# Patient Record
Sex: Male | Born: 1970 | Race: White | Hispanic: No | Marital: Single | Smoking: Never smoker
Health system: Southern US, Community
[De-identification: ages and names within clinical notes are randomized; demographics above are authoritative.]

## PROBLEM LIST (undated history)

## (undated) DIAGNOSIS — M1990 Unspecified osteoarthritis, unspecified site: Secondary | ICD-10-CM

## (undated) DIAGNOSIS — F1010 Alcohol abuse, uncomplicated: Secondary | ICD-10-CM

## (undated) DIAGNOSIS — M069 Rheumatoid arthritis, unspecified: Secondary | ICD-10-CM

## (undated) DIAGNOSIS — A6002 Herpesviral infection of other male genital organs: Secondary | ICD-10-CM

## (undated) HISTORY — DX: Alcohol abuse, uncomplicated: F10.10

## (undated) HISTORY — DX: Herpesviral infection of other male genital organs: A60.02

## (undated) HISTORY — DX: Unspecified osteoarthritis, unspecified site: M19.90

## (undated) HISTORY — DX: Rheumatoid arthritis, unspecified: M06.9

---

## 2013-08-06 ENCOUNTER — Ambulatory Visit (INDEPENDENT_AMBULATORY_CARE_PROVIDER_SITE_OTHER): Payer: No Typology Code available for payment source | Admitting: Physician Assistant

## 2013-08-06 ENCOUNTER — Encounter: Payer: Self-pay | Admitting: Physician Assistant

## 2013-08-06 VITALS — BP 134/82 | HR 64 | Temp 98.2°F | Resp 20 | Ht 67.5 in | Wt 217.0 lb

## 2013-08-06 DIAGNOSIS — A6002 Herpesviral infection of other male genital organs: Secondary | ICD-10-CM

## 2013-08-06 DIAGNOSIS — Z0000 Encounter for general adult medical examination without abnormal findings: Secondary | ICD-10-CM

## 2013-08-06 DIAGNOSIS — M1990 Unspecified osteoarthritis, unspecified site: Secondary | ICD-10-CM

## 2013-08-06 DIAGNOSIS — A6000 Herpesviral infection of urogenital system, unspecified: Secondary | ICD-10-CM

## 2013-08-06 DIAGNOSIS — M129 Arthropathy, unspecified: Secondary | ICD-10-CM

## 2013-08-06 DIAGNOSIS — F1010 Alcohol abuse, uncomplicated: Secondary | ICD-10-CM

## 2013-08-06 HISTORY — DX: Unspecified osteoarthritis, unspecified site: M19.90

## 2013-08-06 LAB — LIPID PANEL
Cholesterol: 204 mg/dL — ABNORMAL HIGH (ref 0–200)
VLDL: 38 mg/dL (ref 0–40)

## 2013-08-06 LAB — COMPLETE METABOLIC PANEL WITH GFR
ALT: 30 U/L (ref 0–53)
AST: 16 U/L (ref 0–37)
Albumin: 4.6 g/dL (ref 3.5–5.2)
Alkaline Phosphatase: 66 U/L (ref 39–117)
BUN: 11 mg/dL (ref 6–23)
CO2: 29 mEq/L (ref 19–32)
Calcium: 9.8 mg/dL (ref 8.4–10.5)
Chloride: 102 mEq/L (ref 96–112)
GFR, Est Non African American: >89 mL/min
Glucose, Bld: 91 mg/dL (ref 70–99)
Potassium: 4.6 mEq/L (ref 3.5–5.3)
Total Protein: 7.3 g/dL (ref 6.0–8.3)

## 2013-08-06 LAB — CBC WITH DIFFERENTIAL/PLATELET
Basophils Relative: 1 % (ref 0–1)
HCT: 46.4 % (ref 39.0–52.0)
Hemoglobin: 15.6 g/dL (ref 13.0–17.0)
Lymphocytes Relative: 22 % (ref 12–46)
Lymphs Abs: 1.5 10*3/uL (ref 0.7–4.0)
Monocytes Absolute: 0.5 10*3/uL (ref 0.1–1.0)
Monocytes Relative: 7 % (ref 3–12)
Neutro Abs: 4.5 10*3/uL (ref 1.7–7.7)
Neutrophils Relative %: 65 % (ref 43–77)
WBC: 6.9 10*3/uL (ref 4.0–10.5)

## 2013-08-06 LAB — SEDIMENTATION RATE: Sed Rate: 5 mm/hr (ref 0–16)

## 2013-08-06 MED ORDER — VALACYCLOVIR HCL 500 MG PO TABS: 500.0000 mg | ORAL_TABLET | Freq: Every day | ORAL | Status: DC

## 2013-08-06 NOTE — Progress Notes (Signed)
Patient ID: John Blackwell MRN: [id number], DOB: February 06, 1971 42 y.o. Date of Encounter: 08/06/2013, 9:25 AM    Chief Complaint: Physical (CPE)  HPI: 42 y.o. y/o white male here to:  1- Establish care with our practice. He reports that he has had no type of medical evaluation in many years. Says he hasn't gone to an urgent care with a respiratory infection or anything in years.  2- does want to go ahead and do a complete physical exam and screening labs today. He notes that he did drink about 6 ounces of juice about 3 hours ago. I discussed that this may affect his cholesterol and glucose but he does not want have to return another day fasting so wants to go ahead and do all screening labs today.  3- has one specific concern that he wants evaluated today. He reports that over the past 2 months he has been having joint pain in different joints on different days.  Says that about 1-1/2 weeks ago the pain was in his hip. Last week he has significant pain in the left shoulder. The past 2 days he is having pain in the left foot. Today his left wrist is very painful and stiff with inflammation. Says that he can tell that his right knee is starting to hurt now. Says that at the time of each area of pain there is no abnormality on inspection. At the time of pain he sees no erythema or warmth. Just pain and stiffness and very minimal swelling secondary to inflammation and stiffness. He has no prior history of this type problem. He has no known family history of any type of similar problem. He has had no rashes and no fevers over the last couple months. He has had no known tick bites but does state that he does hunt and fish. He is taking over-the-counter equally arthritis medication over the last couple months with minimal improvement.  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  4- he does report a history of  genital herpes.  No other past medical history.   Review of Systems: Consitutional: No fever, chills, fatigue, night sweats, lymphadenopathy Eyes: No visual changes, eye redness, or discharge. ENT/Mouth: Ears: No otalgia, tinnitus, hearing loss, discharge. Nose: No congestion, rhinorrhea, sinus pain, or epistaxis. Throat: No sore throat, post nasal drip, or teeth pain. Cardiovascular: No CP, palpitations, diaphoresis, DOE, edema, orthopnea, PND. Respiratory: No cough, hemoptysis, SOB, or wheezing. Gastrointestinal: No anorexia, dysphagia, reflux, pain, nausea, vomiting, hematemesis, diarrhea, constipation, BRBPR, or melena. Genitourinary: No dysuria, frequency, urgency, hematuria, incontinence, nocturia, decreased urinary stream, discharge, impotence, or testicular pain/masses. Musculoskeletal:  See HPI. Remainder of this ROS negative.  Skin: No rash, erythema, lesion changes, pain, warmth, jaundice, or pruritis. Neurological: No headache, dizziness, syncope, seizures, tremors, memory loss, coordination problems, or paresthesias. Psychological: No anxiety, depression, hallucinations, SI/HI. Endocrine: No fatigue, polydipsia, polyphagia, polyuria, or known diabetes. All other systems were reviewed and are otherwise negative.  Past Medical History  Diagnosis Date  . 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 Alcohol abuse   . Arthritis 08/06/2013     History reviewed. No pertinent past surgical history.  Home Meds:  He has been taking equally arthritis pill over-the-counter for the last 2 months. He takes a multivitamin daily.  Allergies: No Known Allergies  History   Social History  . Marital Status: Single    Spouse Name:  N/A    Number of Children: N/A  . Years of Education: N/A   Occupational History  . Gutter Work    Social History Main Topics  . Smoking status: Never Smoker   . Smokeless tobacco: Never Used  . Alcohol Use:     44 Cans of beer per week  . Drug Use: Not  on file  . Sexual Activity: Yes   Other Topics Concern  . 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   Alcohol: 3-5 Beers each week day.                Friday Nights: 12 pack                Saturday: 12 pack                Sundays: None   Says that in past drank more. And also drank liquor in past but rarely drinks any liquor now.      Never smoked.    Single.    Has Girlfriend.   Has lost 25 lbs in past year secondary to diet changes-stopped eating late night.(entered 07/2013)   Was lifting weights until past 2 months-stopped sec to pain.    History reviewed. No pertinent family history.  Physical Exam: Blood pressure 134/82, pulse 64, temperature 98.2 F (36.8 C), temperature source Oral, resp. rate 20, height 5' 7.5" (1.715 m), weight 217 lb (98.431 kg).  General: Well developed, well nourished, WM. Appears in no acute distress. HEENT: Normocephalic, atraumatic. Conjunctiva pink, sclera non-icteric. Pupils 2 mm constricting to 1 mm, round, regular, and equally reactive to light and accomodation. EOMI. Internal auditory canal clear. TMs with good cone of light and without pathology. Nasal mucosa pink. Nares are without discharge. No sinus tenderness. Oral mucosa pink. . Pharynx without exudate.   Neck: Supple. Trachea midline. No thyromegaly. Full ROM. No lymphadenopathy. Lungs: Clear to auscultation bilaterally without wheezes, rales, or rhonchi. Breathing is of normal effort and unlabored. Cardiovascular: RRR with S1 S2. No murmurs, rubs, or gallops. Distal pulses 2+ symmetrically. No carotid or abdominal bruits. Abdomen: Soft, non-tender, non-distended with normoactive bowel sounds. No hepatosplenomegaly or masses. No rebound/guarding. No CVA tenderness. No hernias. Musculoskeletal: Full range of motion and 5/5 strength throughout. Without swelling, atrophy, tenderness, crepitus, or warmth. Extremities without clubbing, cyanosis, or edema. Calves  supple. Left wrist has decreased range of motion secondary to stiffness and inflammation. There is no erythema or warmth. All other joints appear normal and have normal range of motion. Skin: Warm and moist without erythema, ecchymosis, wounds, or rash. Neuro: A+Ox3. CN II-XII grossly intact. Moves all extremities spontaneously. Full sensation throughout. Normal gait. DTR 2+ throughout upper and lower extremities. Finger to nose intact. Psych:  Responds to questions appropriately with a normal affect.   Assessment/Plan:  42 y.o. y/o  male here for CPE -1. Visit for preventive health examination  A. Screening Labs:  He had 6 ounces of juice about 3 hours ago. However he does not want to return another day fasting so he wants to do all labs today that he knows of juice may affect glucose and lipid panel. - CBC with Differential - COMPLETE METABOLIC PANEL WITH GFR - Lipid panel - TSH - Vit D  25 hydroxy (rtn osteoporosis monitoring)  2. Arthritis - Sedimentation rate - Rocky mtn spotted fvr abs pnl(IgG+IgM) - B. burgdorfi antibodies by WB - CK - Uric acid -  ANA - Rheumatoid factor  3. Alcohol abuse Discussed the need to decrease alcohol consumption. Discussed effects on liver. Discussed setting a limit and goal of number of beers he is going to drink during the week and also on the weekend. - COMPLETE METABOLIC PANEL WITH GFR  4. 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 Discussed possible viral shedding even when he is asymptomatic. - valACYclovir (VALTREX) 500 MG tablet; Take 1 tablet (500 mg total) by mouth daily.  Dispense: 30 tablet; Refill: 11  5. Immunizations: Need to make sure that his tetanus is up-to-date. Unfortunately he had left the office prior to getting this performed. Will ask him to come in for this and we called the lab results. Signed:   [address] [place], [place]  08/06/2013 9:25 AM

## 2013-08-07 LAB — ANTI-NUCLEAR AB-TITER (ANA TITER): ANA Titer 1: NEGATIVE " "

## 2013-08-07 LAB — ROCKY MTN SPOTTED FVR ABS PNL(IGG+IGM)
RMSF IgG: 0.3 IV
RMSF IgM: 0.66 IV

## 2013-08-07 LAB — VITAMIN D 25 HYDROXY (VIT D DEFICIENCY, FRACTURES): Vit D, 25-Hydroxy: 72 ng/mL (ref 30–89)

## 2013-08-07 LAB — B. BURGDORFI ANTIBODIES BY WB: B burgdorferi IgG Abs (IB): NEGATIVE

## 2013-08-07 LAB — ANA: Anti Nuclear Antibody(ANA): POSITIVE — AB

## 2013-08-07 LAB — TSH: TSH: 0.372 u[IU]/mL (ref 0.350–4.500)

## 2013-08-11 ENCOUNTER — Telehealth: Payer: Self-pay | Admitting: Family Medicine

## 2013-08-11 DIAGNOSIS — M129 Arthropathy, unspecified: Secondary | ICD-10-CM

## 2013-08-11 NOTE — Telephone Encounter (Signed)
Spoke to patient.  Aware of lab results.  Rheumatology referral ordered

## 2013-08-11 NOTE — Telephone Encounter (Signed)
Message copied by Donne Anon on Mon Aug 11, 2013  4:41 PM ------      Message from: Allayne Butcher      Created: Mon Aug 11, 2013  7:54 AM       The patient was seen as a new patient for both a complete physical and he had complaints of 2 months of migratory arthritis.      Tell him that his rheumatoid factor number is elevated. Definitely needs to see a rheumatologist.      We will not start any medications and we'll leave this all to rheumatology.      Tell him all other labs are okay. Decrease alcohol intake as directed at the office visit.      Place an order for  Rheumatology.  Reason: Migratory arthritis symptoms in different joints for the past 2 months  . Elevated rheumatoid factor. Positive ANA. ------

## 2013-11-18 ENCOUNTER — Telehealth: Payer: Self-pay | Admitting: Family Medicine

## 2013-11-18 ENCOUNTER — Encounter: Payer: Self-pay | Admitting: Family Medicine

## 2013-11-18 DIAGNOSIS — M2550 Pain in unspecified joint: Secondary | ICD-10-CM | POA: Insufficient documentation

## 2013-11-18 NOTE — Telephone Encounter (Signed)
Provider rec'd letter from Rheumatologist.  They recommended patient have a Pneumococcal  And Zoster vaccination.  Pt stated he was aware but had not had time.  He also asked about Flu shot.  I told pt we can do Flu and Pneumonia shots here.  Come during normal hours, no appt needed.  Check with his insurance to see if they will cover Zostavax and we can order for him to have at the pharmacy of his choice.

## 2013-12-19 ENCOUNTER — Encounter: Payer: Self-pay | Admitting: Family Medicine

## 2013-12-19 DIAGNOSIS — M2550 Pain in unspecified joint: Secondary | ICD-10-CM

## 2013-12-19 DIAGNOSIS — R768 Other specified abnormal immunological findings in serum: Secondary | ICD-10-CM | POA: Insufficient documentation

## 2014-04-08 ENCOUNTER — Encounter: Payer: Self-pay | Admitting: Family Medicine

## 2014-08-24 ENCOUNTER — Other Ambulatory Visit: Payer: Self-pay | Admitting: Physician Assistant

## 2014-08-24 ENCOUNTER — Encounter: Payer: Self-pay | Admitting: Family Medicine

## 2014-08-24 NOTE — Telephone Encounter (Signed)
Medication refill for one time only.  Patient needs to be seen.  Letter sent for patient to call and schedule.  Pt has not been seen in over one year.

## 2014-08-27 ENCOUNTER — Encounter: Payer: Self-pay | Admitting: Family Medicine

## 2014-08-27 DIAGNOSIS — M1990 Unspecified osteoarthritis, unspecified site: Secondary | ICD-10-CM

## 2014-08-27 DIAGNOSIS — M069 Rheumatoid arthritis, unspecified: Secondary | ICD-10-CM | POA: Insufficient documentation

## 2014-10-14 ENCOUNTER — Encounter: Payer: Self-pay | Admitting: Physician Assistant

## 2014-10-14 ENCOUNTER — Ambulatory Visit (INDEPENDENT_AMBULATORY_CARE_PROVIDER_SITE_OTHER): Payer: No Typology Code available for payment source | Admitting: Physician Assistant

## 2014-10-14 VITALS — BP 108/78 | HR 68 | Temp 98.5°F | Resp 18 | Ht 68.0 in | Wt 228.0 lb

## 2014-10-14 DIAGNOSIS — F1010 Alcohol abuse, uncomplicated: Secondary | ICD-10-CM

## 2014-10-14 DIAGNOSIS — A6002 Herpesviral infection of other male genital organs: Secondary | ICD-10-CM

## 2014-10-14 DIAGNOSIS — M1990 Unspecified osteoarthritis, unspecified site: Secondary | ICD-10-CM

## 2014-10-14 DIAGNOSIS — M069 Rheumatoid arthritis, unspecified: Secondary | ICD-10-CM

## 2014-10-14 DIAGNOSIS — R1900 Intra-abdominal and pelvic swelling, mass and lump, unspecified site: Secondary | ICD-10-CM | POA: Insufficient documentation

## 2014-10-14 DIAGNOSIS — R768 Other specified abnormal immunological findings in serum: Secondary | ICD-10-CM

## 2014-10-14 DIAGNOSIS — A609 Anogenital herpesviral infection, unspecified: Secondary | ICD-10-CM

## 2014-10-14 DIAGNOSIS — M2550 Pain in unspecified joint: Secondary | ICD-10-CM

## 2014-10-14 MED ORDER — VALACYCLOVIR HCL 500 MG PO TABS: 500.0000 mg | ORAL_TABLET | Freq: Every day | ORAL | Status: DC

## 2014-10-14 NOTE — Progress Notes (Signed)
Patient ID: John Blackwell MRN: [id number], DOB: 13-Apr-1971 43 y.o. Date of Encounter: 10/14/2014, 9:45 AM    Chief Complaint: Needs Refill on Valtrex. Also mass right flank.    Patient has only been seen in this office one time prior to today's visit. That was for a complete physical exam on 08/06/2013 with me. The following is copied from that office visit note. COPIED FROM CPE NOTE 08/06/2013:    HPI: 43 y.o. y/o white male here to:  1- Establish care with our practice. He reports that he has had no type of medical evaluation in many years. Says he hasn't gone to an urgent care with a respiratory infection or anything in years.  2- does want to go ahead and do a complete physical exam and screening labs today. He notes that he did drink about 6 ounces of juice about 3 hours ago. I discussed that this may affect his cholesterol and glucose but he does not want have to return another day fasting so wants to go ahead and do all screening labs today.  3- has one specific concern that he wants evaluated today. He reports that over the past 2 months he has been having joint pain in different joints on different days.  Says that about 1-1/2 weeks ago the pain was in his hip. Last week he has significant pain in the left shoulder. The past 2 days he is having pain in the left foot. Today his left wrist is very painful and stiff with inflammation. Says that he can tell that his right knee is starting to hurt now. Says that at the time of each area of pain there is no abnormality on inspection. At the time of pain he sees no erythema or warmth. Just pain and stiffness and very minimal swelling secondary to inflammation and stiffness. He has no prior history of this type problem. He has no known family history of any type of similar problem. He has had no rashes and no fevers over the last couple months. He has had no known tick bites but does state that he does hunt and fish. He is taking  over-the-counter equally arthritis medication over the last couple months with minimal improvement.  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  4- he does report a history of genital herpes.  No other past medical history.     TODAY---10/14/2014:   Today patient says that he needs refills on his Valtrex. That he has continued to take it daily. Says that he has had no flares or episodes of herpes over the past year with taking this medication daily.  Says that he is seeing Dr. Corliss Skains routinely.   Says that he does have one other thing that he wanted to check on today. Has a mass on his right flank which he says is been there for years. Says that it really doesn't seem to be getting a lot larger but is wondering if he should have it removed. No other complaints or concerns today.  Review of Systems: See HPI. All other systems were reviewed and are otherwise negative.  Past Medical History  Diagnosis Date  . 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 Alcohol abuse   . Arthritis 08/06/2013    OA of hands,feet, knees  . RA (rheumatoid arthritis)      History reviewed. No pertinent past surgical history.  Home Meds:  Outpatient Prescriptions Prior to Visit  Medication Sig Dispense Refill  . ibuprofen (ADVIL,MOTRIN) 200 MG tablet Take 200 mg by mouth as needed.    . Multiple Vitamin (MULTIVITAMIN) tablet Take 1 tablet by mouth daily.    . naproxen (NAPROSYN) 250 MG tablet Take by mouth as needed.    . SULFASALAZINE PO Take 2 tablets by mouth 2 (two) times daily.    . valACYclovir (VALTREX) 500 MG tablet TAKE 1 TABLET BY MOUTH DAILY 30 tablet 0   No facility-administered medications prior to visit.     Allergies: No Known Allergies  History   Social History  . Marital Status: Single    Spouse Name: N/A    Number of Children: N/A  . Years of Education: N/A   Occupational History  .  Gutter Work    Social History Main Topics  . Smoking status: Never Smoker   . Smokeless tobacco: Never Used  . Alcohol Use:     44 Cans of beer per week  . Drug Use: Not on file  . Sexual Activity: Yes   Other Topics Concern  . 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   Alcohol: 3-5 Beers each week day.                Friday Nights: 12 pack                Saturday: 12 pack                Sundays: None   Says that in past drank more. And also drank liquor in past but rarely drinks any liquor now.      Never smoked.    Single.    Has Girlfriend.   Has lost 25 lbs in past year secondary to diet changes-stopped eating late night.(entered 07/2013)   Was lifting weights until past 2 months-stopped sec to pain.    History reviewed. No pertinent family history.  Physical Exam: Blood pressure 108/78, pulse 68, temperature 98.5 F (36.9 C), temperature source Oral, resp. rate 18, height  (1.727 m), weight 228 lb (103.42 kg).  General: Well developed, well nourished, WM. Appears in no acute distress. Neck: Supple. Trachea midline. No thyromegaly. Full ROM. No lymphadenopathy. Lungs: Clear to auscultation bilaterally without wheezes, rales, or rhonchi. Breathing is of normal effort and unlabored. Cardiovascular: RRR with S1 S2. No murmurs, rubs, or gallops. Distal pulses 2+ symmetrically. No carotid or abdominal bruits. Musculoskeletal: Normal strength and tone for age. Skin: Right Flank: Approximate 1 inch diameter superficial mass. Mobile. No erythema.  Neuro: A+Ox3. CN II-XII grossly intact. Moves all extremities spontaneously.  Psych:  Responds to questions appropriately with a normal affect.   Assessment/Plan:  43 y.o. y/o  male here for   1. RA (rheumatoid arthritis)---Per Dr. Corliss Skains  2. Rheumatoid factor positive--per Dr. Corliss Skains  3. Arthralgia of multiple sites--Per Dr. Corliss Skains  4. Arthritis----------Per Dr. Corliss Skains  5.  Alcohol abuse--h/o   6. 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  Dispense: 90 tablet; Refill: 3  7. Right flank mass - Ambulatory referral to General Surgery  8. Immunizations--at visit 09/2014 discussed influenza vaccine and tetanus vaccine but he defers both.     THE FOLLOWING IS COPIED FROM CPE 07/2013:   CPE -1. Visit for preventive health examination  A. Screening Labs:  He had 6 ounces  of juice about 3 hours ago. However he does not want to return another day fasting so he wants to do all labs today that he knows of juice may affect glucose and lipid panel. - CBC with Differential - COMPLETE METABOLIC PANEL WITH GFR - Lipid panel - TSH - Vit D  25 hydroxy (rtn osteoporosis monitoring)  2. Arthritis - Sedimentation rate - Rocky mtn spotted fvr abs pnl(IgG+IgM) - B. burgdorfi antibodies by WB - CK - Uric acid - ANA - Rheumatoid factor  3. Alcohol abuse Discussed the need to decrease alcohol consumption. Discussed effects on liver. Discussed setting a limit and goal of number of beers he is going to drink during the week and also on the weekend. - COMPLETE METABOLIC PANEL WITH GFR  4. 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 Discussed possible viral shedding even when he is asymptomatic. - valACYclovir (VALTREX) 500 MG tablet; Take 1 tablet (500 mg total) by mouth daily.  Dispense: 30 tablet; Refill: 11  5. Immunizations: Need to make sure that his tetanus is up-to-date. Unfortunately he had left the office prior to getting this performed. Will ask him to come in for this and we called the lab results. I discussed immunizations at his visit 09/2014. Discussed influenza vaccine but he defers. Discussed  tetanus vaccine. He is uncertain of the date of his last one. Defers receiving this today. Signed:   [address] [place],  [place]  10/14/2014 9:45 AM

## 2015-11-25 ENCOUNTER — Encounter: Payer: Self-pay | Admitting: Family Medicine

## 2015-11-25 ENCOUNTER — Other Ambulatory Visit: Payer: Self-pay | Admitting: Physician Assistant

## 2015-11-25 NOTE — Telephone Encounter (Signed)
Medication refill for one time only.  Patient needs to be seen.  Letter sent for patient to call and schedule.  Has been over one year since last visit

## 2016-01-07 ENCOUNTER — Other Ambulatory Visit: Payer: Self-pay | Admitting: Physician Assistant

## 2016-01-07 NOTE — Telephone Encounter (Signed)
Pt has not been seen since 2015, refills denied.  Has been sent letter to make appt.

## 2016-01-28 ENCOUNTER — Other Ambulatory Visit: Payer: Self-pay | Admitting: Family Medicine

## 2016-01-28 ENCOUNTER — Other Ambulatory Visit: Payer: BLUE CROSS/BLUE SHIELD

## 2016-01-28 DIAGNOSIS — Z79899 Other long term (current) drug therapy: Secondary | ICD-10-CM

## 2016-01-28 DIAGNOSIS — Z0000 Encounter for general adult medical examination without abnormal findings: Secondary | ICD-10-CM

## 2016-01-28 DIAGNOSIS — E785 Hyperlipidemia, unspecified: Secondary | ICD-10-CM

## 2016-01-28 LAB — CBC WITH DIFFERENTIAL/PLATELET
Basophils Absolute: 0 10*3/uL (ref 0.0–0.1)
Basophils Relative: 0 % (ref 0–1)
Eosinophils Absolute: 0.2 10*3/uL (ref 0.0–0.7)
Eosinophils Relative: 3 % (ref 0–5)
HEMATOCRIT: 45.2 % (ref 39.0–52.0)
HEMOGLOBIN: 14.7 g/dL (ref 13.0–17.0)
LYMPHS ABS: 2.1 10*3/uL (ref 0.7–4.0)
LYMPHS PCT: 40 % (ref 12–46)
MCH: 27.9 pg (ref 26.0–34.0)
MCHC: 32.5 g/dL (ref 30.0–36.0)
MCV: 85.8 fL (ref 78.0–100.0)
MONO ABS: 0.4 10*3/uL (ref 0.1–1.0)
MONOS PCT: 8 % (ref 3–12)
MPV: 10 fL (ref 8.6–12.4)
NEUTROS ABS: 2.5 10*3/uL (ref 1.7–7.7)
Neutrophils Relative %: 49 % (ref 43–77)
Platelets: 199 10*3/uL (ref 150–400)
RBC: 5.27 MIL/uL (ref 4.22–5.81)
RDW: 13.2 % (ref 11.5–15.5)
WBC: 5.2 10*3/uL (ref 4.0–10.5)

## 2016-01-28 LAB — LIPID PANEL
CHOL/HDL RATIO: 5.7 ratio — AB (ref <=5.0)
Cholesterol: 159 mg/dL (ref 125–200)
HDL: 28 mg/dL — ABNORMAL LOW (ref >=40)
LDL Cholesterol: 97 mg/dL (ref <130)
TRIGLYCERIDES: 171 mg/dL — AB (ref <150)
VLDL: 34 mg/dL — ABNORMAL HIGH (ref <30)

## 2016-01-28 LAB — COMPLETE METABOLIC PANEL WITH GFR
ALBUMIN: 4.4 g/dL (ref 3.6–5.1)
ALK PHOS: 55 U/L (ref 40–115)
ALT: 33 U/L (ref 9–46)
AST: 17 U/L (ref 10–40)
BUN: 16 mg/dL (ref 7–25)
CALCIUM: 9 mg/dL (ref 8.6–10.3)
CHLORIDE: 103 mmol/L (ref 98–110)
CO2: 25 mmol/L (ref 20–31)
Creat: 0.95 mg/dL (ref 0.60–1.35)
GFR, Est African American: >89 mL/min (ref >=60)
Glucose, Bld: 132 mg/dL — ABNORMAL HIGH (ref 70–99)
POTASSIUM: 4.4 mmol/L (ref 3.5–5.3)
SODIUM: 136 mmol/L (ref 135–146)
Total Bilirubin: 0.5 mg/dL (ref 0.2–1.2)
Total Protein: 6.8 g/dL (ref 6.1–8.1)

## 2016-01-28 LAB — TSH: TSH: 0.61 m[IU]/L (ref 0.40–4.50)

## 2016-01-31 ENCOUNTER — Encounter: Payer: Self-pay | Admitting: Physician Assistant

## 2016-01-31 ENCOUNTER — Ambulatory Visit (INDEPENDENT_AMBULATORY_CARE_PROVIDER_SITE_OTHER): Payer: BLUE CROSS/BLUE SHIELD | Admitting: Physician Assistant

## 2016-01-31 VITALS — BP 124/80 | HR 68 | Temp 98.2°F | Resp 18 | Ht 67.5 in | Wt 220.0 lb

## 2016-01-31 DIAGNOSIS — Z0000 Encounter for general adult medical examination without abnormal findings: Secondary | ICD-10-CM | POA: Diagnosis not present

## 2016-01-31 DIAGNOSIS — Z23 Encounter for immunization: Secondary | ICD-10-CM | POA: Diagnosis not present

## 2016-01-31 MED ORDER — VALACYCLOVIR HCL 500 MG PO TABS: 500.0000 mg | ORAL_TABLET | Freq: Every day | ORAL | Status: DC

## 2016-01-31 NOTE — Progress Notes (Signed)
Patient ID: John Blackwell MRN: [id number], DOB: 08-Apr-1971 45 y.o. Date of Encounter: 01/31/2016, 9:10 AM    Chief Complaint: CPE   COPIED FROM CPE NOTE 08/06/2013:  HPI: 45 y.o. y/o white male here to:  1- Establish care with our practice. He reports that he has had no type of medical evaluation in many years. Says he hasn't gone to an urgent care with a respiratory infection or anything in years.  2- does want to go ahead and do a complete physical exam and screening labs today. He notes that he did drink about 6 ounces of juice about 3 hours ago. I discussed that this may affect his cholesterol and glucose but he does not want have to return another day fasting so wants to go ahead and do all screening labs today.  3- has one specific concern that he wants evaluated today. He reports that over the past 2 months he has been having joint pain in different joints on different days.  Says that about 1-1/2 weeks ago the pain was in his hip. Last week he has significant pain in the left shoulder. The past 2 days he is having pain in the left foot. Today his left wrist is very painful and stiff with inflammation. Says that he can tell that his right knee is starting to hurt now. Says that at the time of each area of pain there is no abnormality on inspection. At the time of pain he sees no erythema or warmth. Just pain and stiffness and very minimal swelling secondary to inflammation and stiffness. He has no prior history of this type problem. He has no known family history of any type of similar problem. He has had no rashes and no fevers over the last couple months. He has had no known tick bites but does state that he does hunt and fish. He is taking over-the-counter equally arthritis medication over the last couple months with minimal improvement.  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  4- he  does report a history of genital herpes.  At that OV, reported significant alcohol use.  No other past medical history.  AT THAT OV: Checked labs that were c/w Rheumatoid Arthritis. Discussed the need to decrease alcohol consumption. Discussed effects on liver. Discussed setting a limit and goal of number of beers he is going to drink during the week and also on the weekend.   OV---10/14/2014:  Today patient says that he needs refills on his Valtrex. That he has continued to take it daily. Says that he has had no flares or episodes of herpes over the past year with taking this medication daily. Says that he is seeing Dr. Corliss Skains routinely.   OV/CPE 01/31/2016: Says that he is still seeing Dr. Corliss Skains for his rheumatoid arthritis. Says that he does need refill on his Valtrex. Says he has no updates regarding his medical history or family medical history. Still no premature family history of colon cancer or prostate cancer.   Review of Systems: See HPI. All other systems were reviewed and are otherwise negative.  Past Medical History  Diagnosis Date  . 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 Alcohol abuse   . Arthritis 08/06/2013    OA of hands,feet, knees  . RA (rheumatoid arthritis) (HCC)      No past surgical history on file.  Home Meds:  Outpatient Prescriptions Prior  to Visit  Medication Sig Dispense Refill  . ibuprofen (ADVIL,MOTRIN) 200 MG tablet Take 200 mg by mouth as needed.    . Multiple Vitamin (MULTIVITAMIN) tablet Take 1 tablet by mouth daily.    . naproxen (NAPROSYN) 250 MG tablet Take by mouth as needed.    . SULFASALAZINE PO Take 2 tablets by mouth 2 (two) times daily.    . valACYclovir (VALTREX) 500 MG tablet TAKE 1 TABLET BY MOUTH DAILY 30 tablet 0   No facility-administered medications prior to visit.     Allergies: No Known Allergies  History   Social History  . Marital Status: Single    Spouse Name: N/A    Number of Children: N/A  . Years of  Education: N/A   Occupational History  . Gutter Work    Social History Main Topics  . Smoking status: Never Smoker   . Smokeless tobacco: Never Used  . Alcohol Use:     44 Cans of beer per week  . Drug Use: Not on file  . Sexual Activity: Yes   Other Topics Concern  . 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   Alcohol: 3-5 Beers each week day.                Friday Nights: 12 pack                Saturday: 12 pack                Sundays: None   Says that in past drank more. And also drank liquor in past but rarely drinks any liquor now.      Never smoked.    Single.    Has Girlfriend.   Has lost 25 lbs in past year secondary to diet changes-stopped eating late night.(entered 07/2013)   Was lifting weights until past 2 months-stopped sec to pain.    No family history on file.  Physical Exam: Blood pressure 124/80, pulse 68, temperature 98.2 F (36.8 C), temperature source Oral, resp. rate 18, height 5' 7.5" (1.715 m), weight 220 lb (99.791 kg).  General: Well developed, well nourished, WM. Appears in no acute distress. Neck: Supple. Trachea midline. No thyromegaly. Full ROM. No lymphadenopathy. Lungs: Clear to auscultation bilaterally without wheezes, rales, or rhonchi. Breathing is of normal effort and unlabored. Cardiovascular: RRR with S1 S2. No murmurs, rubs, or gallops. Distal pulses 2+ symmetrically. No carotid or abdominal bruits. Musculoskeletal: Normal strength and tone for age. Skin: Right Flank: Approximate 1 inch diameter superficial mass. Mobile. No erythema.  Neuro: A+Ox3. CN II-XII grossly intact. Moves all extremities spontaneously.  Psych:  Responds to questions appropriately with a normal affect.   Assessment/Plan:  45 y.o. y/o  male here for    CPE -1. Visit for preventive health examination  A. Screening Labs:  He came fasting for labs recently. Reviewed results today - CBC with  Differential---------------------------------------------Normal - COMPLETE METABOLIC PANEL WITH GFR------------Glucose Elevated----Add A1C and f/u that result - Lipid panel------------------------------------------------------------ok. HDL low.   B. Immunizations: Tdap---Agreeable to update today--01/31/2016--Given here  RA (rheumatoid arthritis) ---Per Dr. Corliss Skains  Rheumatoid factor positive --per Dr. Corliss Skains  Arthralgia of multiple sites --Per Dr. Corliss Skains  Arthritis ----------Per Dr. Corliss Skains  Alcohol abuse--h/o  ----Congrats on significant decrease in Alcohol reported at CPE 01/31/2016 !!  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  Dispense: 90  tablet; Refill: 3    Signed:   [address] [place], [place]  01/31/2016 9:10 AM

## 2016-02-01 ENCOUNTER — Other Ambulatory Visit: Payer: Self-pay | Admitting: Family Medicine

## 2016-02-01 DIAGNOSIS — R739 Hyperglycemia, unspecified: Secondary | ICD-10-CM

## 2016-04-10 ENCOUNTER — Encounter (HOSPITAL_COMMUNITY): Payer: Self-pay | Admitting: *Deleted

## 2016-04-10 DIAGNOSIS — R1031 Right lower quadrant pain: Secondary | ICD-10-CM | POA: Insufficient documentation

## 2016-04-10 DIAGNOSIS — Z5321 Procedure and treatment not carried out due to patient leaving prior to being seen by health care provider: Secondary | ICD-10-CM

## 2016-04-10 DIAGNOSIS — K352 Acute appendicitis with generalized peritonitis: Secondary | ICD-10-CM | POA: Diagnosis not present

## 2016-04-10 LAB — COMPREHENSIVE METABOLIC PANEL
ALK PHOS: 66 U/L (ref 38–126)
ALT: 27 U/L (ref 17–63)
ANION GAP: 11 (ref 5–15)
AST: 20 U/L (ref 15–41)
Albumin: 4.5 g/dL (ref 3.5–5.0)
BILIRUBIN TOTAL: 0.8 mg/dL (ref 0.3–1.2)
BUN: 14 mg/dL (ref 6–20)
CALCIUM: 9.9 mg/dL (ref 8.9–10.3)
CO2: 22 mmol/L (ref 22–32)
CREATININE: 1.2 mg/dL (ref 0.61–1.24)
Chloride: 107 mmol/L (ref 101–111)
GFR calc non Af Amer: >60 mL/min (ref >60)
Glucose, Bld: 94 mg/dL (ref 65–99)
Potassium: 4 mmol/L (ref 3.5–5.1)
Sodium: 140 mmol/L (ref 135–145)
TOTAL PROTEIN: 7.8 g/dL (ref 6.5–8.1)

## 2016-04-10 LAB — CBC WITH DIFFERENTIAL/PLATELET
BASOS ABS: 0 10*3/uL (ref 0.0–0.1)
BASOS PCT: 0 %
EOS ABS: 0.2 10*3/uL (ref 0.0–0.7)
Eosinophils Relative: 2 %
HEMATOCRIT: 41.7 % (ref 39.0–52.0)
Hemoglobin: 13.7 g/dL (ref 13.0–17.0)
Lymphocytes Relative: 29 %
Lymphs Abs: 3.3 10*3/uL (ref 0.7–4.0)
MCH: 27.5 pg (ref 26.0–34.0)
MCHC: 32.9 g/dL (ref 30.0–36.0)
MCV: 83.6 fL (ref 78.0–100.0)
MONO ABS: 0.8 10*3/uL (ref 0.1–1.0)
MONOS PCT: 7 %
NEUTROS ABS: 6.8 10*3/uL (ref 1.7–7.7)
NEUTROS PCT: 62 %
Platelets: 260 10*3/uL (ref 150–400)
RBC: 4.99 MIL/uL (ref 4.22–5.81)
RDW: 12.9 % (ref 11.5–15.5)
WBC: 11.1 10*3/uL — ABNORMAL HIGH (ref 4.0–10.5)

## 2016-04-10 LAB — URINALYSIS, ROUTINE W REFLEX MICROSCOPIC
BILIRUBIN URINE: NEGATIVE
GLUCOSE, UA: NEGATIVE mg/dL
Hgb urine dipstick: NEGATIVE
KETONES UR: 15 mg/dL — AB
LEUKOCYTES UA: NEGATIVE
NITRITE: NEGATIVE
PH: 5.5 (ref 5.0–8.0)
PROTEIN: NEGATIVE mg/dL
Specific Gravity, Urine: 1.03 (ref 1.005–1.030)

## 2016-04-10 LAB — LIPASE, BLOOD: Lipase: 29 U/L (ref 11–51)

## 2016-04-10 NOTE — ED Notes (Signed)
Patient presents with c/o right lower abd pain "feels like there is a knife sticking in there"

## 2016-04-10 NOTE — ED Notes (Signed)
Pt drinking Sprite in lobby

## 2016-04-11 ENCOUNTER — Encounter (HOSPITAL_COMMUNITY): Payer: Self-pay | Admitting: Emergency Medicine

## 2016-04-11 ENCOUNTER — Emergency Department (HOSPITAL_COMMUNITY): Payer: BLUE CROSS/BLUE SHIELD

## 2016-04-11 ENCOUNTER — Inpatient Hospital Stay (HOSPITAL_COMMUNITY): Payer: BLUE CROSS/BLUE SHIELD | Admitting: Certified Registered Nurse Anesthetist

## 2016-04-11 ENCOUNTER — Emergency Department (HOSPITAL_COMMUNITY)
Admission: EM | Admit: 2016-04-11 | Discharge: 2016-04-11 | Disposition: A | Discharge status: Home / Self Care | Payer: BLUE CROSS/BLUE SHIELD | Source: Home / Self Care

## 2016-04-11 ENCOUNTER — Inpatient Hospital Stay (HOSPITAL_COMMUNITY)
Admission: EM | Admit: 2016-04-11 | Discharge: 2016-04-14 | DRG: 340 | Disposition: A | Discharge status: Home / Self Care | Payer: BLUE CROSS/BLUE SHIELD | Attending: General Surgery | Admitting: General Surgery

## 2016-04-11 ENCOUNTER — Encounter (HOSPITAL_COMMUNITY): Admission: EM | Disposition: A | Discharge status: Home / Self Care | Payer: Self-pay | Source: Home / Self Care

## 2016-04-11 DIAGNOSIS — M069 Rheumatoid arthritis, unspecified: Secondary | ICD-10-CM | POA: Diagnosis present

## 2016-04-11 DIAGNOSIS — R1031 Right lower quadrant pain: Secondary | ICD-10-CM | POA: Diagnosis present

## 2016-04-11 DIAGNOSIS — F1010 Alcohol abuse, uncomplicated: Secondary | ICD-10-CM | POA: Diagnosis present

## 2016-04-11 DIAGNOSIS — A6002 Herpesviral infection of other male genital organs: Secondary | ICD-10-CM | POA: Diagnosis present

## 2016-04-11 DIAGNOSIS — K352 Acute appendicitis with generalized peritonitis: Secondary | ICD-10-CM | POA: Diagnosis present

## 2016-04-11 DIAGNOSIS — K353 Acute appendicitis with localized peritonitis, without perforation or gangrene: Secondary | ICD-10-CM

## 2016-04-11 DIAGNOSIS — R5082 Postprocedural fever: Secondary | ICD-10-CM | POA: Diagnosis present

## 2016-04-11 DIAGNOSIS — K3532 Acute appendicitis with perforation and localized peritonitis, without abscess: Secondary | ICD-10-CM | POA: Diagnosis present

## 2016-04-11 HISTORY — PX: LAPAROSCOPIC APPENDECTOMY: SHX408

## 2016-04-11 SURGERY — APPENDECTOMY, LAPAROSCOPIC
Anesthesia: General | Site: Abdomen

## 2016-04-11 MED ORDER — DEXAMETHASONE SODIUM PHOSPHATE 10 MG/ML IJ SOLN
INTRAMUSCULAR | Status: DC | PRN
  Administered 2016-04-11: 10 mg via INTRAVENOUS

## 2016-04-11 MED ORDER — LACTATED RINGERS IV SOLN: INTRAVENOUS | Status: DC

## 2016-04-11 MED ORDER — ONDANSETRON HCL 4 MG/2ML IJ SOLN
4.0000 mg | Freq: Once | INTRAMUSCULAR | Status: AC | PRN
Start: 2016-04-11 — End: 2016-04-11
  Administered 2016-04-11: 4 mg via INTRAVENOUS

## 2016-04-11 MED ORDER — LORAZEPAM 1 MG PO TABS: 1.0000 mg | ORAL_TABLET | Freq: Four times a day (QID) | ORAL | Status: AC | PRN

## 2016-04-11 MED ORDER — FENTANYL CITRATE (PF) 250 MCG/5ML IJ SOLN
INTRAMUSCULAR | Status: AC
  Filled 2016-04-11: qty 5

## 2016-04-11 MED ORDER — HYDROMORPHONE HCL 1 MG/ML IJ SOLN
0.5000 mg | INTRAMUSCULAR | Status: DC | PRN
  Administered 2016-04-11: 1 mg via INTRAVENOUS
  Filled 2016-04-11: qty 1

## 2016-04-11 MED ORDER — PIPERACILLIN-TAZOBACTAM 3.375 G IVPB
3.3750 g | Freq: Three times a day (TID) | INTRAVENOUS | Status: DC
  Administered 2016-04-11 – 2016-04-14 (×8): 3.375 g via INTRAVENOUS
  Filled 2016-04-11 (×9): qty 50

## 2016-04-11 MED ORDER — METRONIDAZOLE IN NACL 5-0.79 MG/ML-% IV SOLN
500.0000 mg | Freq: Three times a day (TID) | INTRAVENOUS | Status: DC
  Administered 2016-04-11: 500 mg via INTRAVENOUS

## 2016-04-11 MED ORDER — MIDAZOLAM HCL 2 MG/2ML IJ SOLN
INTRAMUSCULAR | Status: AC
  Administered 2016-04-11: 1 mg
  Filled 2016-04-11: qty 2

## 2016-04-11 MED ORDER — PIPERACILLIN-TAZOBACTAM 3.375 G IVPB 30 MIN
3.3750 g | Freq: Once | INTRAVENOUS | Status: DC
  Filled 2016-04-11: qty 50

## 2016-04-11 MED ORDER — MEPERIDINE HCL 50 MG/ML IJ SOLN: 6.2500 mg | INTRAMUSCULAR | Status: DC | PRN

## 2016-04-11 MED ORDER — ROCURONIUM BROMIDE 100 MG/10ML IV SOLN
INTRAVENOUS | Status: AC
  Filled 2016-04-11: qty 1

## 2016-04-11 MED ORDER — LORAZEPAM 2 MG/ML IJ SOLN
0.0000 mg | Freq: Four times a day (QID) | INTRAMUSCULAR | Status: AC
Start: 2016-04-11 — End: 2016-04-13

## 2016-04-11 MED ORDER — FENTANYL CITRATE (PF) 100 MCG/2ML IJ SOLN
INTRAMUSCULAR | Status: AC
  Filled 2016-04-11: qty 2

## 2016-04-11 MED ORDER — ONDANSETRON HCL 4 MG/2ML IJ SOLN
4.0000 mg | Freq: Once | INTRAMUSCULAR | Status: AC
  Administered 2016-04-11: 4 mg via INTRAVENOUS
  Filled 2016-04-11: qty 2

## 2016-04-11 MED ORDER — IOPAMIDOL (ISOVUE-300) INJECTION 61%
100.0000 mL | Freq: Once | INTRAVENOUS | Status: AC | PRN
  Administered 2016-04-11: 100 mL via INTRAVENOUS

## 2016-04-11 MED ORDER — SUGAMMADEX SODIUM 200 MG/2ML IV SOLN
INTRAVENOUS | Status: AC
  Filled 2016-04-11: qty 2

## 2016-04-11 MED ORDER — HYDROMORPHONE HCL 1 MG/ML IJ SOLN
0.2500 mg | INTRAMUSCULAR | Status: DC | PRN
  Administered 2016-04-11: 0.5 mg via INTRAVENOUS

## 2016-04-11 MED ORDER — FAMOTIDINE IN NACL 20-0.9 MG/50ML-% IV SOLN
20.0000 mg | Freq: Two times a day (BID) | INTRAVENOUS | Status: DC
  Administered 2016-04-11 – 2016-04-12 (×3): 20 mg via INTRAVENOUS
  Filled 2016-04-11 (×5): qty 50

## 2016-04-11 MED ORDER — SUGAMMADEX SODIUM 200 MG/2ML IV SOLN
INTRAVENOUS | Status: DC | PRN
  Administered 2016-04-11: 200 mg via INTRAVENOUS

## 2016-04-11 MED ORDER — ONDANSETRON HCL 4 MG/2ML IJ SOLN
INTRAMUSCULAR | Status: DC | PRN
  Administered 2016-04-11: 4 mg via INTRAVENOUS

## 2016-04-11 MED ORDER — HYDROMORPHONE HCL 1 MG/ML IJ SOLN
1.0000 mg | Freq: Once | INTRAMUSCULAR | Status: AC
  Administered 2016-04-11: 1 mg via INTRAVENOUS
  Filled 2016-04-11: qty 1

## 2016-04-11 MED ORDER — BUPIVACAINE-EPINEPHRINE (PF) 0.5% -1:200000 IJ SOLN
INTRAMUSCULAR | Status: AC
  Filled 2016-04-11: qty 30

## 2016-04-11 MED ORDER — ONDANSETRON 4 MG PO TBDP: 4.0000 mg | ORAL_TABLET | Freq: Four times a day (QID) | ORAL | Status: DC | PRN

## 2016-04-11 MED ORDER — SODIUM CHLORIDE 0.9 % IV SOLN
Freq: Once | INTRAVENOUS | Status: AC
  Administered 2016-04-11: 06:00:00 via INTRAVENOUS

## 2016-04-11 MED ORDER — VITAMIN B-1 100 MG PO TABS
100.0000 mg | ORAL_TABLET | Freq: Every day | ORAL | Status: DC
  Administered 2016-04-11 – 2016-04-14 (×4): 100 mg via ORAL
  Filled 2016-04-11 (×4): qty 1

## 2016-04-11 MED ORDER — LACTATED RINGERS IR SOLN
Status: DC | PRN
  Administered 2016-04-11: 1000 mL

## 2016-04-11 MED ORDER — DEXAMETHASONE SODIUM PHOSPHATE 10 MG/ML IJ SOLN
INTRAMUSCULAR | Status: AC
  Filled 2016-04-11: qty 1

## 2016-04-11 MED ORDER — FOLIC ACID 1 MG PO TABS
1.0000 mg | ORAL_TABLET | Freq: Every day | ORAL | Status: DC
  Administered 2016-04-11 – 2016-04-14 (×4): 1 mg via ORAL
  Filled 2016-04-11 (×4): qty 1

## 2016-04-11 MED ORDER — PROPOFOL 10 MG/ML IV BOLUS
INTRAVENOUS | Status: DC | PRN
  Administered 2016-04-11: 120 mg via INTRAVENOUS

## 2016-04-11 MED ORDER — 0.9 % SODIUM CHLORIDE (POUR BTL) OPTIME
TOPICAL | Status: DC | PRN
  Administered 2016-04-11: 1000 mL

## 2016-04-11 MED ORDER — HYDROMORPHONE HCL 1 MG/ML IJ SOLN
INTRAMUSCULAR | Status: AC
  Filled 2016-04-11: qty 1

## 2016-04-11 MED ORDER — ADULT MULTIVITAMIN W/MINERALS CH
1.0000 | ORAL_TABLET | Freq: Every day | ORAL | Status: DC
  Administered 2016-04-11 – 2016-04-14 (×4): 1 via ORAL
  Filled 2016-04-11 (×4): qty 1

## 2016-04-11 MED ORDER — ROCURONIUM BROMIDE 100 MG/10ML IV SOLN
INTRAVENOUS | Status: DC | PRN
  Administered 2016-04-11: 40 mg via INTRAVENOUS
  Administered 2016-04-11 (×2): 10 mg via INTRAVENOUS

## 2016-04-11 MED ORDER — SUCCINYLCHOLINE CHLORIDE 20 MG/ML IJ SOLN
INTRAMUSCULAR | Status: DC | PRN
  Administered 2016-04-11: 100 mg via INTRAVENOUS

## 2016-04-11 MED ORDER — POTASSIUM CHLORIDE IN NACL 20-0.9 MEQ/L-% IV SOLN
INTRAVENOUS | Status: DC
  Administered 2016-04-11 – 2016-04-14 (×8): via INTRAVENOUS
  Filled 2016-04-11 (×14): qty 1000

## 2016-04-11 MED ORDER — LORAZEPAM 2 MG/ML IJ SOLN: 1.0000 mg | Freq: Four times a day (QID) | INTRAMUSCULAR | Status: AC | PRN

## 2016-04-11 MED ORDER — LIDOCAINE HCL (CARDIAC) 20 MG/ML IV SOLN
INTRAVENOUS | Status: DC | PRN
  Administered 2016-04-11: 100 mg via INTRAVENOUS

## 2016-04-11 MED ORDER — FENTANYL CITRATE (PF) 100 MCG/2ML IJ SOLN
INTRAMUSCULAR | Status: DC | PRN
  Administered 2016-04-11: 100 ug via INTRAVENOUS
  Administered 2016-04-11 (×2): 50 ug via INTRAVENOUS

## 2016-04-11 MED ORDER — DEXTROSE 5 % IV SOLN
2.0000 g | Freq: Once | INTRAVENOUS | Status: AC
  Administered 2016-04-11: 2 g via INTRAVENOUS
  Filled 2016-04-11: qty 2

## 2016-04-11 MED ORDER — THIAMINE HCL 100 MG/ML IJ SOLN
100.0000 mg | Freq: Every day | INTRAMUSCULAR | Status: DC
  Filled 2016-04-11 (×4): qty 1

## 2016-04-11 MED ORDER — MIDAZOLAM HCL 2 MG/2ML IJ SOLN
INTRAMUSCULAR | Status: AC
  Filled 2016-04-11: qty 2

## 2016-04-11 MED ORDER — DEXTROSE 5 % IV SOLN
2.0000 g | INTRAVENOUS | Status: DC
  Administered 2016-04-11: 2 g via INTRAVENOUS
  Filled 2016-04-11: qty 2

## 2016-04-11 MED ORDER — PROPOFOL 10 MG/ML IV BOLUS
INTRAVENOUS | Status: AC
  Filled 2016-04-11: qty 20

## 2016-04-11 MED ORDER — LIDOCAINE HCL (CARDIAC) 20 MG/ML IV SOLN
INTRAVENOUS | Status: AC
  Filled 2016-04-11: qty 5

## 2016-04-11 MED ORDER — LORAZEPAM 2 MG/ML IJ SOLN: 0.0000 mg | Freq: Two times a day (BID) | INTRAMUSCULAR | Status: DC

## 2016-04-11 MED ORDER — METRONIDAZOLE IN NACL 5-0.79 MG/ML-% IV SOLN
INTRAVENOUS | Status: AC
  Filled 2016-04-11: qty 100

## 2016-04-11 MED ORDER — LACTATED RINGERS IV SOLN
INTRAVENOUS | Status: DC | PRN
  Administered 2016-04-11 (×2): via INTRAVENOUS

## 2016-04-11 MED ORDER — MIDAZOLAM HCL 5 MG/5ML IJ SOLN
INTRAMUSCULAR | Status: DC | PRN
  Administered 2016-04-11: 2 mg via INTRAVENOUS

## 2016-04-11 MED ORDER — MORPHINE SULFATE (PF) 2 MG/ML IV SOLN
1.0000 mg | INTRAVENOUS | Status: DC | PRN
  Administered 2016-04-11: 2 mg via INTRAVENOUS
  Administered 2016-04-11: 1 mg via INTRAVENOUS
  Administered 2016-04-12 (×2): 2 mg via INTRAVENOUS
  Filled 2016-04-11 (×4): qty 1

## 2016-04-11 MED ORDER — ONDANSETRON HCL 4 MG/2ML IJ SOLN
INTRAMUSCULAR | Status: AC
  Filled 2016-04-11: qty 2

## 2016-04-11 MED ORDER — ONDANSETRON HCL 4 MG/2ML IJ SOLN
4.0000 mg | Freq: Four times a day (QID) | INTRAMUSCULAR | Status: DC | PRN
  Administered 2016-04-13: 4 mg via INTRAVENOUS
  Filled 2016-04-11: qty 2

## 2016-04-11 MED ORDER — FENTANYL CITRATE (PF) 100 MCG/2ML IJ SOLN
50.0000 ug | INTRAMUSCULAR | Status: AC | PRN
  Administered 2016-04-11 (×2): 50 ug via NASAL
  Filled 2016-04-11 (×2): qty 2

## 2016-04-11 MED ORDER — METRONIDAZOLE IN NACL 5-0.79 MG/ML-% IV SOLN
500.0000 mg | Freq: Once | INTRAVENOUS | Status: AC
  Administered 2016-04-11: 500 mg via INTRAVENOUS
  Filled 2016-04-11: qty 100

## 2016-04-11 MED ORDER — BUPIVACAINE HCL (PF) 0.25 % IJ SOLN
INTRAMUSCULAR | Status: AC
  Filled 2016-04-11: qty 30

## 2016-04-11 MED ORDER — FENTANYL CITRATE (PF) 100 MCG/2ML IJ SOLN
50.0000 ug | INTRAMUSCULAR | Status: DC | PRN
  Administered 2016-04-11 (×2): 50 ug via INTRAVENOUS

## 2016-04-11 SURGICAL SUPPLY — 40 items
APL SKNCLS STERI-STRIP NONHPOA (GAUZE/BANDAGES/DRESSINGS)
APPLIER CLIP ROT 10 11.4 M/L (STAPLE)
APR CLP MED LRG 11.4X10 (STAPLE)
BAG SPEC RTRVL LRG 6X4 10 (ENDOMECHANICALS) ×1
BENZOIN TINCTURE PRP APPL 2/3 (GAUZE/BANDAGES/DRESSINGS) IMPLANT
CABLE HIGH FREQUENCY MONO STRZ (ELECTRODE) ×3 IMPLANT
CHLORAPREP W/TINT 26ML (MISCELLANEOUS) ×3 IMPLANT
CLIP APPLIE ROT 10 11.4 M/L (STAPLE) IMPLANT
CLOSURE WOUND 1/2 X4 (GAUZE/BANDAGES/DRESSINGS)
COVER SURGICAL LIGHT HANDLE (MISCELLANEOUS) ×3 IMPLANT
CUTTER FLEX LINEAR 45M (STAPLE) ×2 IMPLANT
DECANTER SPIKE VIAL GLASS SM (MISCELLANEOUS) ×3 IMPLANT
DRAPE LAPAROSCOPIC ABDOMINAL (DRAPES) ×3 IMPLANT
ELECT REM PT RETURN 9FT ADLT (ELECTROSURGICAL) ×3
ELECTRODE REM PT RTRN 9FT ADLT (ELECTROSURGICAL) ×1 IMPLANT
ENDOLOOP SUT PDS II  0 18 (SUTURE)
ENDOLOOP SUT PDS II 0 18 (SUTURE) IMPLANT
GLOVE SURG SIGNA 7.5 PF LTX (GLOVE) ×3 IMPLANT
GOWN STRL REUS W/TWL XL LVL3 (GOWN DISPOSABLE) ×6 IMPLANT
KIT BASIN OR (CUSTOM PROCEDURE TRAY) ×3 IMPLANT
LIQUID BAND (GAUZE/BANDAGES/DRESSINGS) ×3 IMPLANT
POUCH SPECIMEN RETRIEVAL 10MM (ENDOMECHANICALS) ×3 IMPLANT
RELOAD 45 VASCULAR/THIN (ENDOMECHANICALS) IMPLANT
RELOAD STAPLE 45 2.5 WHT GRN (ENDOMECHANICALS) IMPLANT
RELOAD STAPLE 45 3.5 BLU ETS (ENDOMECHANICALS) IMPLANT
RELOAD STAPLE TA45 3.5 REG BLU (ENDOMECHANICALS) ×3 IMPLANT
SCISSORS LAP 5X35 DISP (ENDOMECHANICALS) ×3 IMPLANT
SET IRRIG TUBING LAPAROSCOPIC (IRRIGATION / IRRIGATOR) ×3 IMPLANT
SHEARS HARMONIC ACE PLUS 36CM (ENDOMECHANICALS) ×3 IMPLANT
SLEEVE XCEL OPT CAN 5 100 (ENDOMECHANICALS) ×3 IMPLANT
STRIP CLOSURE SKIN 1/2X4 (GAUZE/BANDAGES/DRESSINGS) IMPLANT
SUT MNCRL AB 4-0 PS2 18 (SUTURE) ×3 IMPLANT
SUT VIC AB 2-0 SH 18 (SUTURE) IMPLANT
TOWEL OR 17X26 10 PK STRL BLUE (TOWEL DISPOSABLE) ×3 IMPLANT
TOWEL OR NON WOVEN STRL DISP B (DISPOSABLE) ×3 IMPLANT
TRAY FOLEY W/METER SILVER 14FR (SET/KITS/TRAYS/PACK) ×3 IMPLANT
TRAY LAPAROSCOPIC (CUSTOM PROCEDURE TRAY) ×3 IMPLANT
TROCAR BLADELESS OPT 5 100 (ENDOMECHANICALS) ×3 IMPLANT
TROCAR XCEL BLUNT TIP 100MML (ENDOMECHANICALS) ×3 IMPLANT
TROCAR XCEL NON-BLD 11X100MML (ENDOMECHANICALS) ×2 IMPLANT

## 2016-04-11 NOTE — Anesthesia Postprocedure Evaluation (Signed)
Anesthesia Post Note  Patient: John Blackwell  Procedure(s) Performed: Procedure(s) (LRB): APPENDECTOMY LAPAROSCOPIC (N/A)  Patient location during evaluation: PACU Anesthesia Type: General Level of consciousness: awake and alert Pain management: pain level controlled Vital Signs Assessment: post-procedure vital signs reviewed and stable Respiratory status: spontaneous breathing, nonlabored ventilation, respiratory function stable and patient connected to nasal cannula oxygen Cardiovascular status: blood pressure returned to baseline and stable Postop Assessment: no signs of nausea or vomiting Anesthetic complications: no    Last Vitals:  Filed Vitals:   04/11/16 1345 04/11/16 1405  BP: 129/85 128/81  Pulse: 88 80  Temp: 36.7 C 37.2 C  Resp: 17 17    Last Pain:  Filed Vitals:   04/11/16 1407  PainSc: 3                  Marelly Wehrman DAVID

## 2016-04-11 NOTE — ED Notes (Signed)
Patient transported to CT 

## 2016-04-11 NOTE — ED Provider Notes (Signed)
CSN: [id number]     Arrival date & time 04/11/16  0128 History   First MD Initiated Contact with Patient 04/11/16 0403     Chief Complaint  Patient presents with  . Abdominal Pain  . Nausea     (Consider location/radiation/quality/duration/timing/severity/associated sxs/prior Treatment) HPI Comments: RLQ pain that started around 7:00 pm last night associated with nausea and vomiting. He reports a loss of appetite. No known fever. He states urinating has been difficult but painless. No cough, SOB or chest pain.  Patient is a 45 y.o. male presenting with abdominal pain. The history is provided by the patient. No language interpreter was used.  Abdominal Pain Pain location:  RLQ Associated symptoms: nausea and vomiting   Associated symptoms: no chest pain, no chills, no dysuria, no fever and no shortness of breath     Past Medical History  Diagnosis Date  . 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 Alcohol abuse   . Arthritis 08/06/2013    OA of hands,feet, knees  . RA (rheumatoid arthritis) (HCC)    History reviewed. No pertinent past surgical history. No family history on file. Social History  Substance Use Topics  . Smoking status: Never Smoker   . Smokeless tobacco: Never Used  . Alcohol Use: 26.4 oz/week    44 Cans of beer per week    Review of Systems  Constitutional: Negative for fever and chills.  Respiratory: Negative.  Negative for shortness of breath.   Cardiovascular: Negative.  Negative for chest pain.  Gastrointestinal: Positive for nausea, vomiting and abdominal pain.  Genitourinary: Positive for difficulty urinating. Negative for dysuria.  Musculoskeletal: Negative.  Negative for neck stiffness.  Skin: Negative.   Neurological: Negative.       Allergies  Review of patient's allergies indicates no known allergies.  Home Medications   Prior to Admission medications   Medication Sig Start Date End Date Taking? Authorizing Provider  ibuprofen  (ADVIL,MOTRIN) 200 MG tablet Take 200 mg by mouth as needed.    Historical Provider, MD  Multiple Vitamin (MULTIVITAMIN) tablet Take 1 tablet by mouth daily.    Historical Provider, MD  naproxen (NAPROSYN) 250 MG tablet Take by mouth as needed.    Historical Provider, MD  sulfaSALAzine (AZULFIDINE) 500 MG EC tablet TK 2 TS PO BID 01/07/16   Historical Provider, MD  valACYclovir (VALTREX) 500 MG tablet Take 1 tablet (500 mg total) by mouth daily. 01/31/16   Mary B Dixon, PA-C   BP 117/51 mmHg  Pulse 83  Temp(Src) 99.8 F (37.7 C) (Oral)  Resp 16  SpO2 98% Physical Exam  Constitutional: He is oriented to person, place, and time. He appears well-developed and well-nourished.  HENT:  Head: Normocephalic.  Neck: Normal range of motion. Neck supple.  Cardiovascular: Normal rate and regular rhythm.   No murmur heard. Pulmonary/Chest: Effort normal and breath sounds normal. He has no wheezes. He has no rales.  Abdominal: Soft. Bowel sounds are normal. There is tenderness (RLQ tenderness with positive Rovsing sign). There is no rebound and no guarding.  Musculoskeletal: Normal range of motion.  Neurological: He is alert and oriented to person, place, and time.  Skin: Skin is warm and dry. No rash noted.  Psychiatric: He has a normal mood and affect.    ED Course  Procedures (including critical care time) Labs Review Labs Reviewed - No data to display  Imaging Review No results found. I have personally reviewed and evaluated these images and lab results as  part of my medical decision-making.   EKG Interpretation None     Results for orders placed or performed during the hospital encounter of 04/11/16  Lipase, blood  Result Value Ref Range   Lipase 29 11 - 51 U/L  Comprehensive metabolic panel  Result Value Ref Range   Sodium 140 135 - 145 mmol/L   Potassium 4.0 3.5 - 5.1 mmol/L   Chloride 107 101 - 111 mmol/L   CO2 22 22 - 32 mmol/L   Glucose, Bld 94 65 - 99 mg/dL   BUN 14 6 -  20 mg/dL   Creatinine, Ser 1.61 0.61 - 1.24 mg/dL   Calcium 9.9 8.9 - 09.6 mg/dL   Total Protein 7.8 6.5 - 8.1 g/dL   Albumin 4.5 3.5 - 5.0 g/dL   AST 20 15 - 41 U/L   ALT 27 17 - 63 U/L   Alkaline Phosphatase 66 38 - 126 U/L   Total Bilirubin 0.8 0.3 - 1.2 mg/dL   GFR calc non Af Amer >60 >60 mL/min   GFR calc Af Amer >60 >60 mL/min   Anion gap 11 5 - 15  Urinalysis, Routine w reflex microscopic  Result Value Ref Range   Color, Urine YELLOW YELLOW   APPearance CLEAR CLEAR   Specific Gravity, Urine 1.030 1.005 - 1.030   pH 5.5 5.0 - 8.0   Glucose, UA NEGATIVE NEGATIVE mg/dL   Hgb urine dipstick NEGATIVE NEGATIVE   Bilirubin Urine NEGATIVE NEGATIVE   Ketones, ur 15 (A) NEGATIVE mg/dL   Protein, ur NEGATIVE NEGATIVE mg/dL   Nitrite NEGATIVE NEGATIVE   Leukocytes, UA NEGATIVE NEGATIVE  CBC with Differential  Result Value Ref Range   WBC 11.1 (H) 4.0 - 10.5 K/uL   RBC 4.99 4.22 - 5.81 MIL/uL   Hemoglobin 13.7 13.0 - 17.0 g/dL   HCT 04.5 40.9 - 81.1 %   MCV 83.6 78.0 - 100.0 fL   MCH 27.5 26.0 - 34.0 pg   MCHC 32.9 30.0 - 36.0 g/dL   RDW 91.4 78.2 - 95.6 %   Platelets 260 150 - 400 K/uL   Neutrophils Relative % 62 %   Neutro Abs 6.8 1.7 - 7.7 K/uL   Lymphocytes Relative 29 %   Lymphs Abs 3.3 0.7 - 4.0 K/uL   Monocytes Relative 7 %   Monocytes Absolute 0.8 0.1 - 1.0 K/uL   Eosinophils Relative 2 %   Eosinophils Absolute 0.2 0.0 - 0.7 K/uL   Basophils Relative 0 %   Basophils Absolute 0.0 0.0 - 0.1 K/uL   Ct Abdomen Pelvis W Contrast  04/11/2016  CLINICAL DATA:  Right lower quadrant pain for 1 week, worse tonight. EXAM: CT ABDOMEN AND PELVIS WITH CONTRAST TECHNIQUE: Multidetector CT imaging of the abdomen and pelvis was performed using the standard protocol following bolus administration of intravenous contrast. CONTRAST:  ISOVUE-300 IOPAMIDOL (ISOVUE-300) INJECTION 61% COMPARISON:  None. FINDINGS: Atelectasis in the lung bases. The liver, spleen, gallbladder, pancreas,  adrenal glands, kidneys, abdominal aorta, inferior vena cava, and retroperitoneal lymph nodes are unremarkable. Stomach, small bowel, and colon are not abnormally distended. No free air or free fluid in the abdomen. Pelvis: At appendicolith measuring 8 mm diameter. Diffusely distended appendix with appendiceal tip measuring up to about 2.7 cm in diameter. Diffuse stranding in the fat around the appendix. Changes are consistent with acute appendicitis. No abscess. Prostate gland is not enlarged. Bladder wall is not thickened. Minimal free fluid in the pelvis is likely reactive. No  pelvic mass or lymphadenopathy. Degenerative changes in the spine. No destructive bone lesions. IMPRESSION: Prominent distention of the appendix with periappendiceal infiltration and edema and at appendicolith. Changes are consistent with acute appendicitis. No discrete abscess. Electronically Signed   By: Burman Nieves M.D.   On: 04/11/2016 05:05    MDM   Final diagnoses:  None    1. Acute appendicitis  Patient presents with RLQ abdominal pain since yesterday, vomiting, anorexia. There is CT evidence of acute appendicitis without rupture or abscess.   Discussed Dr. Daphine Deutscher (general surgery) who will see the patient in the ED and admit for surgery. Patient and family updated on results and plan. IV NS infusing at 125 ml/hr, abx per order set.     Elpidio Anis, PA-C 04/11/16 0533  Cy Blamer, MD 04/11/16 [phone]

## 2016-04-11 NOTE — ED Notes (Signed)
Awaiting admission orders and room assignment at this time. Family at bedside.

## 2016-04-11 NOTE — ED Notes (Signed)
Attempted to call report at [phone] - was left on hold for extended period of time.  Attempted to call report at [phone] - nurse in patient room - stated will call back in a few minutes.

## 2016-04-11 NOTE — ED Notes (Signed)
Pt reports having Lower abd pain for the last 2 weeks worsening last night.

## 2016-04-11 NOTE — Anesthesia Preprocedure Evaluation (Signed)
Anesthesia Evaluation  Patient identified by MRN, date of birth, ID band Patient awake    Reviewed: Allergy & Precautions, NPO status , Patient's Chart, lab work & pertinent test results  Airway Mallampati: II  TM Distance: >3 FB Neck ROM: Full    Dental   Pulmonary    Pulmonary exam normal        Cardiovascular Normal cardiovascular exam     Neuro/Psych    GI/Hepatic   Endo/Other    Renal/GU      Musculoskeletal  (+) Arthritis , Rheumatoid disorders,    Abdominal   Peds  Hematology   Anesthesia Other Findings   Reproductive/Obstetrics                             Anesthesia Physical Anesthesia Plan  ASA: II  Anesthesia Plan: General   Post-op Pain Management:    Induction: Intravenous, Rapid sequence and Cricoid pressure planned  Airway Management Planned: Oral ETT  Additional Equipment:   Intra-op Plan:   Post-operative Plan: Extubation in OR  Informed Consent: I have reviewed the patients History and Physical, chart, labs and discussed the procedure including the risks, benefits and alternatives for the proposed anesthesia with the patient or authorized representative who has indicated his/her understanding and acceptance.     Plan Discussed with: CRNA and Surgeon  Anesthesia Plan Comments:         Anesthesia Quick Evaluation

## 2016-04-11 NOTE — Op Note (Signed)
Re:   John Blackwell DOB:   [birth year] MRN:   [id number]                   FACILITY:  [facility]  DATE OF PROCEDURE: 04/11/2016                              OPERATIVE REPORT  PREOPERATIVE DIAGNOSIS:  Appendicitis  POSTOPERATIVE DIAGNOSIS:  Acute purulent ruptured appendicitis.  PROCEDURE:  Laparoscopic appendectomy.  SURGEON:  Sandria Bales. Ezzard Standing, MD  ASSISTANT:  No first assistant.  ANESTHESIA:  General endotracheal.  Anesthesiologist: Arta Bruce, MD CRNA: Epimenio Sarin, CRNA; Elyn Peers, CRNA  ASA:  2E  ESTIMATED BLOOD LOSS:  Minimal.  DRAINS: none   SPECIMEN:   Appendix  COUNTS CORRECT:  YES  INDICATIONS FOR PROCEDURE: John Blackwell is a 45 y.o. (DOB: 08/29/1971) white  male whose primary care doctor is [facility] BETH, PA-C and comes to the [facility] OR for an appendectomy.   I discussed with the patient, the indications and potential complications of appendiceal surgery.  The potential complications include, but are not limited to, bleeding, open surgery, bowel resection, and the possibility of another diagnosis.  OPERATIVE NOTE:  The patient underwent a general endotracheal anesthetic as supervised by Anesthesiologist: Arta Bruce, MD CRNA: Epimenio Sarin, CRNA; Elyn Peers, CRNA, General, in  [facility] room #2.  The patient was given Rocephin and Flagyl at the beginning of the procedure and the abdomen was prepped with ChloraPrep.  The patient had a foley catheter placed at the beginning of the procedure.  A time-out was held and surgical checklist run.  An infraumbilical incision was made with sharp dissection carried down to the abdominal cavity.  An 12 mm Hasson trocar was inserted through the infraumbilical incision and into the peritoneal cavity.  A 0 degree 10 mm laparoscope was inserted through a 12 mm Hasson trocar and the Hasson trocar secured with a 0 Vicryl suture.  I placed a 5 mm trocar in the right upper quadrant and 11 mm torcar in left lower quadrant and  did abdominal exploration.    The right and left lobes of liver unremarkable.  Stomach was unremarkable.  The pelvic organs were unremarkable.  I saw no other intra-abdominal abnormality.  The patient had appendicitis with the appendix located right pelvic brim, underneath the inguinal floor.  The appendix was perforated with the terminal ileum and anti-mesenteric fold trying to wall it off.  The mesentery of the appendix was divided with a Harmonic scalpel.  I got to the base of the appendix.  I then used a blue load 45 mm Ethicon Endo-GIA stapler and fired this across the base of the appendix.  I placed the appendix in EndoCatch bag and delivered the bag through the umbilical incision.  I irrigated the abdomen with 1,000 cc of saline.  After irrigating the abdomen, I then removed the trocars, in turn.  The umbilical port fascia was closed with 0 Vicryl suture.   I closed the skin each site with a 4-0 Monocryl suture and painted the wounds with LiquidBand.  I then injected a total of 30 mL of 0.25% Marcaine at the incisions.  Sponge and needle count were correct at the end of the case.  The foley catheter was removed in the OR.  The patient was transferred to the recovery room in good condition.  The patient tolerated the procedure well  and it depends on the patient's post op clinical course as to when the patient could be discharged.   Alphonsa Overall, MD, [facility] Surgery Pager: [phone] Office phone:  [phone]

## 2016-04-11 NOTE — Anesthesia Procedure Notes (Signed)
Procedure Name: Intubation Date/Time: 04/11/2016 11:33 AM Performed by: Epimenio Sarin Pre-anesthesia Checklist: Patient identified, Emergency Drugs available, Suction available, Patient being monitored and Timeout performed Patient Re-evaluated:Patient Re-evaluated prior to inductionOxygen Delivery Method: Circle system utilized Preoxygenation: Pre-oxygenation with 100% oxygen Intubation Type: IV induction, Rapid sequence and Cricoid Pressure applied Laryngoscope Size: Mac and 3 Grade View: Grade II Tube type: Oral Tube size: 7.5 mm Number of attempts: 1 Airway Equipment and Method: Stylet Placement Confirmation: ETT inserted through vocal cords under direct vision,  positive ETCO2 and breath sounds checked- equal and bilateral Secured at: 23 cm Tube secured with: Tape Dental Injury: Teeth and Oropharynx as per pre-operative assessment

## 2016-04-11 NOTE — Progress Notes (Addendum)
Pharmacy Antibiotic Follow-up Note  John Blackwell is a 45 y.o. year-old male admitted on 04/11/2016.  The patient is currently on day 1 of Zosyn for ruptured appendix.  Assessment/Plan: Zosyn 3.375g IV q8h (4 hour infusion).  Will begin at 2200 since 2 doses of Rocephin 2gm given this am  Temp (24hrs), Avg:98.8 F (37.1 C), Min:98.1 F (36.7 C), Max:99.8 F (37.7 C)   Recent Labs Lab 04/10/16 2102  WBC 11.1*    Recent Labs Lab 04/10/16 2102  CREATININE 1.20   Estimated Creatinine Clearance: 89.1 mL/min (by C-G formula based on Cr of 1.2).    No Known Allergies  Antimicrobials this admission: 6/13 Rocephin 2gm x 2 doses this am (0600 in ED & 0829 in OR) 6/13 Metronidazole 500mg  x 2 doses, appropriately scheduled  6/13 Zosyn >>   Microbiology results: No cx ordered at this time  Thank you for allowing pharmacy to be a part of this patient's care. Pharmacy will sign off, will adust if renal function declines.  7/13 PharmD Pager [phone] 04/11/2016, 2:33 PM

## 2016-04-11 NOTE — H&P (Addendum)
John Blackwell is an 45 y.o. male.   Chief Complaint: RLQ pain BDZ:HGDJM,EQAS BETH, PA-C Rheumatology:  Dr. Arlean Hopping  HPI: Pt presents with RLQ pain going to his groin, nausea and vomiting, trouble voiding.  He reports some pain last week or longer that he attributed to a pulled muscle. It got better over the weekend, but after work yesterday he got severe pain around 7 PM last night.  He developed nausea but says he didn't vomit.  He has not eaten since 3 PM yesterday.   Work up in the ED shows hypertension, mild WBC elevation, UA is normal.  CT scan shows: No free air or free fluid in the abdomen.  Prominent distention of the appendix with periappendiceal infiltration and edema and at appendicolith. Changes are consistent with acute appendicitis. No discrete abscess. He is accompanied by his mother.  Past Medical History  Diagnosis Date  . 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 Alcohol abuse   . Arthritis 08/06/2013    OA of hands,feet, knees  . RA (rheumatoid arthritis) ([place])     History reviewed. No pertinent past surgical history.  None    No family history on file. Social History:  reports that he has never smoked. He has never used smokeless tobacco. He reports that he drinks about 26.4 oz of alcohol per week. His drug history is not on file. Divorced x 2 ETOH 10-12 beers per day up till 1 year ago, now down to about 24 oz per day, some days none DRUGS:  Marijuana helps with arthritis Tobacco: none Has his own gutter service, very active on ladders all day.   Allergies: No Known Allergies   Prior to Admission medications   Medication Sig Start Date End Date Taking? Authorizing Provider  ibuprofen (ADVIL,MOTRIN) 200 MG tablet Take 200 mg by mouth as needed for moderate pain.    Yes Historical Provider, MD  naproxen (NAPROSYN) 250 MG tablet Take by mouth as needed.   Yes Historical Provider, MD  sulfaSALAzine (AZULFIDINE) 500 MG EC tablet Take 2 tablets twice daily  01/07/16  Yes Historical Provider, MD  valACYclovir (VALTREX) 500 MG tablet Take 1 tablet (500 mg total) by mouth daily. Patient not taking: Reported on 04/11/2016 01/31/16   Orlena Sheldon, PA-C     Results for orders placed or performed during the hospital encounter of 04/11/16 (from the past 48 hour(s))  Urinalysis, Routine w reflex microscopic     Status: Abnormal   Collection Time: 04/10/16  8:58 PM  Result Value Ref Range   Color, Urine YELLOW YELLOW   APPearance CLEAR CLEAR   Specific Gravity, Urine 1.030 1.005 - 1.030   pH 5.5 5.0 - 8.0   Glucose, UA NEGATIVE NEGATIVE mg/dL   Hgb urine dipstick NEGATIVE NEGATIVE   Bilirubin Urine NEGATIVE NEGATIVE   Ketones, ur 15 (A) NEGATIVE mg/dL   Protein, ur NEGATIVE NEGATIVE mg/dL   Nitrite NEGATIVE NEGATIVE   Leukocytes, UA NEGATIVE NEGATIVE    Comment: MICROSCOPIC NOT DONE ON URINES WITH NEGATIVE PROTEIN, BLOOD, LEUKOCYTES, NITRITE, OR GLUCOSE <1000 mg/dL.  Lipase, blood     Status: None   Collection Time: 04/10/16  9:02 PM  Result Value Ref Range   Lipase 29 11 - 51 U/L  Comprehensive metabolic panel     Status: None   Collection Time: 04/10/16  9:02 PM  Result Value Ref Range   Sodium 140 135 - 145 mmol/L   Potassium 4.0 3.5 - 5.1 mmol/L  Chloride 107 101 - 111 mmol/L   CO2 22 22 - 32 mmol/L   Glucose, Bld 94 65 - 99 mg/dL   BUN 14 6 - 20 mg/dL   Creatinine, Ser 1.20 0.61 - 1.24 mg/dL   Calcium 9.9 8.9 - 10.3 mg/dL   Total Protein 7.8 6.5 - 8.1 g/dL   Albumin 4.5 3.5 - 5.0 g/dL   AST 20 15 - 41 U/L   ALT 27 17 - 63 U/L   Alkaline Phosphatase 66 38 - 126 U/L   Total Bilirubin 0.8 0.3 - 1.2 mg/dL   GFR calc non Af Amer >60 >60 mL/min   GFR calc Af Amer >60 >60 mL/min    Comment: (NOTE) The eGFR has been calculated using the CKD EPI equation. This calculation has not been validated in all clinical situations. eGFR's persistently <60 mL/min signify possible Chronic Kidney Disease.    Anion gap 11 5 - 15  CBC with  Differential     Status: Abnormal   Collection Time: 04/10/16  9:02 PM  Result Value Ref Range   WBC 11.1 (H) 4.0 - 10.5 K/uL   RBC 4.99 4.22 - 5.81 MIL/uL   Hemoglobin 13.7 13.0 - 17.0 g/dL   HCT 41.7 39.0 - 52.0 %   MCV 83.6 78.0 - 100.0 fL   MCH 27.5 26.0 - 34.0 pg   MCHC 32.9 30.0 - 36.0 g/dL   RDW 12.9 11.5 - 15.5 %   Platelets 260 150 - 400 K/uL   Neutrophils Relative % 62 %   Neutro Abs 6.8 1.7 - 7.7 K/uL   Lymphocytes Relative 29 %   Lymphs Abs 3.3 0.7 - 4.0 K/uL   Monocytes Relative 7 %   Monocytes Absolute 0.8 0.1 - 1.0 K/uL   Eosinophils Relative 2 %   Eosinophils Absolute 0.2 0.0 - 0.7 K/uL   Basophils Relative 0 %   Basophils Absolute 0.0 0.0 - 0.1 K/uL   Ct Abdomen Pelvis W Contrast  04/11/2016  CLINICAL DATA:  Right lower quadrant pain for 1 week, worse tonight. EXAM: CT ABDOMEN AND PELVIS WITH CONTRAST TECHNIQUE: Multidetector CT imaging of the abdomen and pelvis was performed using the standard protocol following bolus administration of intravenous contrast. CONTRAST:  180m ISOVUE-300 IOPAMIDOL (ISOVUE-300) INJECTION 61% COMPARISON:  None. FINDINGS: Atelectasis in the lung bases. The liver, spleen, gallbladder, pancreas, adrenal glands, kidneys, abdominal aorta, inferior vena cava, and retroperitoneal lymph nodes are unremarkable. Stomach, small bowel, and colon are not abnormally distended. No free air or free fluid in the abdomen. Pelvis: At appendicolith measuring 8 mm diameter. Diffusely distended appendix with appendiceal tip measuring up to about 2.7 cm in diameter. Diffuse stranding in the fat around the appendix. Changes are consistent with acute appendicitis. No abscess. Prostate gland is not enlarged. Bladder wall is not thickened. Minimal free fluid in the pelvis is likely reactive. No pelvic mass or lymphadenopathy. Degenerative changes in the spine. No destructive bone lesions. IMPRESSION: Prominent distention of the appendix with periappendiceal infiltration  and edema and at appendicolith. Changes are consistent with acute appendicitis. No discrete abscess. Electronically Signed   By: WLucienne CapersM.D.   On: 04/11/2016 05:05    Review of Systems  Constitutional: Negative.   HENT: Negative.   Eyes: Negative.   Respiratory: Negative.   Cardiovascular: Negative.   Gastrointestinal: Positive for nausea, vomiting, abdominal pain and diarrhea (some loose stool yesterday AM). Negative for constipation, blood in stool and melena.  Genitourinary: Negative.  Musculoskeletal: Positive for joint pain. Back pain: Hips and hands with RA.  Skin: Negative.   Neurological: Negative.   Endo/Heme/Allergies: Negative.   Psychiatric/Behavioral: Negative.     Blood pressure 126/76, pulse 79, temperature 99.8 F (37.7 C), temperature source Oral, resp. rate 18, SpO2 94 %. Physical Exam  Constitutional: He is oriented to person, place, and time. He appears well-developed and well-nourished. No distress.  HENT:  Head: Normocephalic and atraumatic.  Eyes: Right eye exhibits no discharge. Left eye exhibits no discharge. No scleral icterus.  Neck: Normal range of motion. Neck supple. No JVD present. No tracheal deviation present. No thyromegaly present.  Cardiovascular: Normal rate, regular rhythm, normal heart sounds and intact distal pulses.   No murmur heard. Respiratory: Effort normal and breath sounds normal. No respiratory distress. He has no wheezes. He has no rales. He exhibits no tenderness.  GI: Soft. He exhibits no distension and no mass. There is tenderness (significantly tender RLQ). There is no rebound and no guarding.  Few BS  Musculoskeletal: He exhibits no edema.  Lymphadenopathy:    He has no cervical adenopathy.  Neurological: He is alert and oriented to person, place, and time. No cranial nerve deficit.  Skin: Skin is warm and dry. No rash noted. He is not diaphoretic. No erythema. No pallor.  Psychiatric: He has a normal mood and  affect. His behavior is normal. Judgment and thought content normal.     Assessment/Plan Acute appendicitis Rheumatoid arthritis, on Azulfidine, with Methotrexate ordered, but not started Hx of ETOH use Herpes Simplex  Plan:  Antibiotics, IV fluids and surgery later this AM. Risk and benefits discussed and pt is agreeable to surgery.  JENNINGS,WILLARD, PA-C 04/11/2016, 7:16 AM   Agree with above. Chart reviewed and patient examined.  I discussed with the patient the indications and risks of appendiceal surgery.  The primary risks of appendiceal surgery include, but are not limited to, bleeding, infection, bowel surgery, and open surgery.  There is also the risk that the patient may have continued symptoms after surgery.  We discussed the typical post-operative recovery course. I tried to answer the patient's questions.  He is not married.  His mother and daughter are here.  Alphonsa Overall, MD, [facility] Surgery Pager: [phone] Office phone:  [phone]

## 2016-04-11 NOTE — Transfer of Care (Signed)
Immediate Anesthesia Transfer of Care Note  Patient: John Blackwell  Procedure(s) Performed: Procedure(s): APPENDECTOMY LAPAROSCOPIC (N/A)  Patient Location: PACU  Anesthesia Type:General  Level of Consciousness:  sedated, patient cooperative and responds to stimulation  Airway & Oxygen Therapy:Patient Spontanous Breathing and Patient connected to face mask oxgen  Post-op Assessment:  Report given to PACU RN and Post -op Vital signs reviewed and stable  Post vital signs:  Reviewed and stable  Last Vitals:  Filed Vitals:   04/11/16 0336 04/11/16 0556  BP: 117/51 126/76  Pulse: 83 79  Temp: 37.7 C   Resp: 16 18    Complications: No apparent anesthesia complications

## 2016-04-11 NOTE — ED Notes (Addendum)
Patient presents from home via EMS for RLQ abdominal pain radiating to right groin, nausea, intermittent vomiting x2 weeks. Also c/o difficulty urinating.  Patient with blood draw at [facility] approximately 2100 yesterday.  Last VS: 142/82, 70hr.

## 2016-04-12 LAB — BASIC METABOLIC PANEL
ANION GAP: 7 (ref 5–15)
BUN: 9 mg/dL (ref 6–20)
CO2: 26 mmol/L (ref 22–32)
Calcium: 8.6 mg/dL — ABNORMAL LOW (ref 8.9–10.3)
Chloride: 105 mmol/L (ref 101–111)
Creatinine, Ser: 1.12 mg/dL (ref 0.61–1.24)
GFR calc Af Amer: >60 mL/min (ref >60)
Glucose, Bld: 124 mg/dL — ABNORMAL HIGH (ref 65–99)
POTASSIUM: 3.8 mmol/L (ref 3.5–5.1)
SODIUM: 138 mmol/L (ref 135–145)

## 2016-04-12 LAB — CBC
HCT: 37.2 % — ABNORMAL LOW (ref 39.0–52.0)
Hemoglobin: 12.4 g/dL — ABNORMAL LOW (ref 13.0–17.0)
MCH: 27.8 pg (ref 26.0–34.0)
MCHC: 33.3 g/dL (ref 30.0–36.0)
MCV: 83.4 fL (ref 78.0–100.0)
PLATELETS: 219 10*3/uL (ref 150–400)
RBC: 4.46 MIL/uL (ref 4.22–5.81)
RDW: 13.1 % (ref 11.5–15.5)
WBC: 18.9 10*3/uL — ABNORMAL HIGH (ref 4.0–10.5)

## 2016-04-12 MED ORDER — ACETAMINOPHEN 325 MG PO TABS
650.0000 mg | ORAL_TABLET | ORAL | Status: DC | PRN
  Administered 2016-04-12 (×2): 650 mg via ORAL
  Filled 2016-04-12 (×2): qty 2

## 2016-04-12 MED ORDER — ACETAMINOPHEN 325 MG PO TABS
650.0000 mg | ORAL_TABLET | ORAL | Status: AC
  Administered 2016-04-13 (×5): 650 mg via ORAL
  Filled 2016-04-12 (×5): qty 2

## 2016-04-12 MED ORDER — SACCHAROMYCES BOULARDII 250 MG PO CAPS
250.0000 mg | ORAL_CAPSULE | Freq: Two times a day (BID) | ORAL | Status: DC
  Administered 2016-04-12 – 2016-04-14 (×5): 250 mg via ORAL
  Filled 2016-04-12 (×5): qty 1

## 2016-04-12 MED ORDER — ACETAMINOPHEN 325 MG PO TABS: 650.0000 mg | ORAL_TABLET | ORAL | Status: DC

## 2016-04-12 MED ORDER — HEPARIN SODIUM (PORCINE) 5000 UNIT/ML IJ SOLN
5000.0000 [IU] | Freq: Three times a day (TID) | INTRAMUSCULAR | Status: DC
  Administered 2016-04-12 – 2016-04-14 (×6): 5000 [IU] via SUBCUTANEOUS
  Filled 2016-04-12 (×6): qty 1

## 2016-04-12 NOTE — Care Management Note (Signed)
Case Management Note  Patient Details  Name: John Blackwell MRN: [id number] Date of Birth: Dec 12, 1970  Subjective/Objective:                  Acute purulent ruptured appendicitis. Action/Plan: Discharge planning Expected Discharge Date:   (unknown)               Expected Discharge Plan:  Home/Self Care  In-House Referral:     Discharge planning Services  CM Consult  Post Acute Care Choice:    Choice offered to:     DME Arranged:    DME Agency:     HH Arranged:    HH Agency:     Status of Service:  In process, will continue to follow  Medicare Important Message Given:    Date Medicare IM Given:    Medicare IM give by:    Date Additional Medicare IM Given:    Additional Medicare Important Message give by:     If discussed at Long Length of Stay Meetings, dates discussed:    Additional Comments: Cm reviewed; no HH services anticipated; CM will follow for pt progression. Yves Dill, RN 04/12/2016, 2:13 PM

## 2016-04-12 NOTE — Progress Notes (Addendum)
1 Day Post-Op  Subjective: Temp up to 103 right now.  That is after a shower.  He is still sore but better than yesterday, lungs are clear.  Objective: Vital signs in last 24 hours: Temp:  [98.1 F (36.7 C)-100.6 F (38.1 C)] 98.8 F (37.1 C) (06/14 0600) Pulse Rate:  [69-103] 103 (06/14 0814) Resp:  [11-19] 16 (06/14 0600) BP: (110-148)/(62-98) 121/72 mmHg (06/14 0814) SpO2:  [95 %-100 %] 100 % (06/14 0600) Weight:  [99.791 kg (220 lb)] 99.791 kg (220 lb) (06/13 1920) Last BM Date: 04/10/16 900 PO Urine 2600 TM 100.6, still some tachycardia 103 at 0800 WBC 18.9 this AM BMP OK Intake/Output from previous day: 06/13 0701 - 06/14 0700 In: 5041.7 [P.O.:900; I.V.:3991.7; IV Piggyback:150] Out: 2610 [Urine:2600; Blood:10] Intake/Output this shift:    General appearance: alert, cooperative and no distress Resp: clear to auscultation bilaterally GI: soft, sore, few BS BP 130/80 mmHg  Pulse 105  Temp(Src) 103.1 F (39.5 C) (Oral)  Resp 18  Ht 5\' 8"  (1.727 m)  Wt 99.791 kg (220 lb)  BMI 33.46 kg/m2  SpO2 94%  Lab Results:   Recent Labs  04/10/16 2102 04/12/16 0349  WBC 11.1* 18.9*  HGB 13.7 12.4*  HCT 41.7 37.2*  PLT 260 219    BMET  Recent Labs  04/10/16 2102 04/12/16 0349  NA 140 138  K 4.0 3.8  CL 107 105  CO2 22 26  GLUCOSE 94 124*  BUN 14 9  CREATININE 1.20 1.12  CALCIUM 9.9 8.6*   PT/INR No results for input(s): LABPROT, INR in the last 72 hours.   Recent Labs Lab 04/10/16 2102  AST 20  ALT 27  ALKPHOS 66  BILITOT 0.8  PROT 7.8  ALBUMIN 4.5     Lipase     Component Value Date/Time   LIPASE 29 04/10/2016 2102     Studies/Results: Ct Abdomen Pelvis W Contrast  04/11/2016  CLINICAL DATA:  Right lower quadrant pain for 1 week, worse tonight. EXAM: CT ABDOMEN AND PELVIS WITH CONTRAST TECHNIQUE: Multidetector CT imaging of the abdomen and pelvis was performed using the standard protocol following bolus administration of intravenous  contrast. CONTRAST:  04/13/2016 ISOVUE-300 IOPAMIDOL (ISOVUE-300) INJECTION 61% COMPARISON:  None. FINDINGS: Atelectasis in the lung bases. The liver, spleen, gallbladder, pancreas, adrenal glands, kidneys, abdominal aorta, inferior vena cava, and retroperitoneal lymph nodes are unremarkable. Stomach, small bowel, and colon are not abnormally distended. No free air or free fluid in the abdomen. Pelvis: At appendicolith measuring 8 mm diameter. Diffusely distended appendix with appendiceal tip measuring up to about 2.7 cm in diameter. Diffuse stranding in the fat around the appendix. Changes are consistent with acute appendicitis. No abscess. Prostate gland is not enlarged. Bladder wall is not thickened. Minimal free fluid in the pelvis is likely reactive. No pelvic mass or lymphadenopathy. Degenerative changes in the spine. No destructive bone lesions. IMPRESSION: Prominent distention of the appendix with periappendiceal infiltration and edema and at appendicolith. Changes are consistent with acute appendicitis. No discrete abscess. Electronically Signed   By: M.D.   On: 04/11/2016 05:05    Medications: . famotidine (PEPCID) IV  20 mg Intravenous Q12H  . folic acid  1 mg Oral Daily  . LORazepam  0-4 mg Intravenous Q6H   Followed by  . [START ON 04/13/2016] LORazepam  0-4 mg Intravenous Q12H  . multivitamin with minerals  1 tablet Oral Daily  . piperacillin-tazobactam (ZOSYN)  IV  3.375 g Intravenous  Q8H  . thiamine  100 mg Oral Daily   Or  . thiamine  100 mg Intravenous Daily   . 0.9 % NaCl with KCl 20 mEq / L 125 mL/hr at 04/12/16 1008    Rheumatoid arthritis, on Azulfidine, with Methotrexate ordered, but not started Hx of ETOH use Herpes Simplex  Assessment/Plan Acute purulent ruptured appendicitis S/P laparoscopic appendectomy, 6/13/23m Dr. Ezzard Standing FEN: Clears/ advance as tolerated/continue IV fluids ID: Ceftriaxone/Flagyl - converted to Zosyn , Day 2 Zosyn DVT:  SCD/ add  heparin today   Plan:  Continue fluids, continue antibiotics, advance diet as tolerated, blood cultures now, recheck labs in AM.      LOS: 1 day    JENNINGS,WILLARD 04/12/2016 [phone]  Agree with above.  Early post op course as expected after ruptured appendicitis. Still with chills.  Mother in room.  I answered questions regarding post op course.  Ovidio Kin, MD, [facility] Surgery Pager: [phone] Office phone:  [phone]

## 2016-04-12 NOTE — Discharge Instructions (Signed)
Laparoscopic Appendectomy, Adult, Care After °Refer to this sheet in the next few weeks. These instructions provide you with information on caring for yourself after your procedure. Your caregiver may also give you more specific instructions. Your treatment has been planned according to current medical practices, but problems sometimes occur. Call your caregiver if you have any problems or questions after your procedure. °HOME CARE INSTRUCTIONS °· Do not drive while taking narcotic pain medicines. °· Use stool softener if you become constipated from your pain medicines. °· Change your bandages (dressings) as directed. °· Keep your wounds clean and dry. You may wash the wounds gently with soap and water. Gently pat the wounds dry with a clean towel. °· Do not take baths, swim, or use hot tubs for 10 days, or as instructed by your caregiver. °· Only take over-the-counter or prescription medicines for pain, discomfort, or fever as directed by your caregiver. °· You may continue your normal diet as directed. °· Do not lift more than 10 pounds (4.5 kg) or play contact sports for 3 weeks, or as directed. °· Slowly increase your activity after surgery. °· Take deep breaths to avoid getting a lung infection (pneumonia). °SEEK MEDICAL CARE IF: °· You have redness, swelling, or increasing pain in your wounds. °· You have pus coming from your wounds. °· You have drainage from a wound that lasts longer than 1 day. °· You notice a bad smell coming from the wounds or dressing. °· Your wound edges break open after stitches (sutures) have been removed. °· You notice increasing pain in the shoulders (shoulder strap areas) or near your shoulder blades. °· You develop dizzy episodes or fainting while standing. °· You develop shortness of breath. °· You develop persistent nausea or vomiting. °· You cannot control your bowel functions or lose your appetite. °· You develop diarrhea. °SEEK IMMEDIATE MEDICAL CARE IF:  °· You have a  fever. °· You develop a rash. °· You have difficulty breathing or sharp pains in your chest. °· You develop any reaction or side effects to medicines given. °MAKE SURE YOU: °· Understand these instructions. °· Will watch your condition. °· Will get help right away if you are not doing well or get worse. °  °This information is not intended to replace advice given to you by your health care provider. Make sure you discuss any questions you have with your health care provider. °  °Document Released: 10/16/2005 Document Revised: 03/02/2015 Document Reviewed: 04/05/2015 °Elsevier Interactive Patient Education ©2016 [employer] ° °CCS ______[facility] °LAPAROSCOPIC SURGERY: POST OP INSTRUCTIONS °Always review your discharge instruction sheet given to you by the facility where your surgery was performed. °IF YOU HAVE DISABILITY OR FAMILY LEAVE FORMS, YOU MUST BRING THEM TO THE OFFICE FOR PROCESSING.   °DO NOT GIVE THEM TO YOUR DOCTOR. ° °1. A prescription for pain medication may be given to you upon discharge.  Take your pain medication as prescribed, if needed.  If narcotic pain medicine is not needed, then you may take acetaminophen (Tylenol) or ibuprofen (Advil) as needed. °2. Take your usually prescribed medications unless otherwise directed. °3. If you need a refill on your pain medication, please contact your pharmacy.  They will contact our office to request authorization. Prescriptions will not be filled after 5pm or on week-ends. °4. You should follow a light diet the first few days after arrival home, such as soup and crackers, etc.  Be sure to include lots of fluids daily. °5. Most   patients will experience some swelling and bruising in the area of the incisions.  Ice packs will help.  Swelling and bruising can take several days to resolve.  °6. It is common to experience some constipation if taking pain medication after surgery.  Increasing fluid intake and taking a stool softener (such as  Colace) will usually help or prevent this problem from occurring.  A mild laxative (Milk of Magnesia or Miralax) should be taken according to package instructions if there are no bowel movements after 48 hours. °7. Unless discharge instructions indicate otherwise, you may remove your bandages 24-48 hours after surgery, and you may shower at that time.  You may have steri-strips (small skin tapes) in place directly over the incision.  These strips should be left on the skin for 7-10 days.  If your surgeon used skin glue on the incision, you may shower in 24 hours.  The glue will flake off over the next 2-3 weeks.  Any sutures or staples will be removed at the office during your follow-up visit. °8. ACTIVITIES:  You may resume regular (light) daily activities beginning the next day--such as daily self-care, walking, climbing stairs--gradually increasing activities as tolerated.  You may have sexual intercourse when it is comfortable.  Refrain from any heavy lifting or straining until approved by your doctor. °a. You may drive when you are no longer taking prescription pain medication, you can comfortably wear a seatbelt, and you can safely maneuver your car and apply brakes. °b. RETURN TO WORK:  __________________________________________________________ °9. You should see your doctor in the office for a follow-up appointment approximately 2-3 weeks after your surgery.  Make sure that you call for this appointment within a day or two after you arrive home to insure a convenient appointment time. °10. OTHER INSTRUCTIONS: __________________________________________________________________________________________________________________________ __________________________________________________________________________________________________________________________ °WHEN TO CALL YOUR DOCTOR: °1. Fever over 101.0 °2. Inability to urinate °3. Continued bleeding from incision. °4. Increased pain, redness, or drainage from the  incision. °5. Increasing abdominal pain ° °The clinic staff is available to answer your questions during regular business hours.  Please don’t hesitate to call and ask to speak to one of the nurses for clinical concerns.  If you have a medical emergency, go to the nearest emergency room or call 911.  A surgeon from [facility] is always on call at the hospital. °[address], [address], [place], [place]  [address] ? [address] [address], [place], [place]   [address] °[phone] ? [phone] ? FAX [phone] °Web site: [email or web address] ° °

## 2016-04-13 DIAGNOSIS — K3532 Acute appendicitis with perforation and localized peritonitis, without abscess: Secondary | ICD-10-CM | POA: Diagnosis present

## 2016-04-13 LAB — BASIC METABOLIC PANEL
ANION GAP: 6 (ref 5–15)
BUN: 10 mg/dL (ref 6–20)
CHLORIDE: 107 mmol/L (ref 101–111)
CO2: 24 mmol/L (ref 22–32)
Calcium: 8.7 mg/dL — ABNORMAL LOW (ref 8.9–10.3)
Creatinine, Ser: 1.04 mg/dL (ref 0.61–1.24)
GFR calc Af Amer: >60 mL/min (ref >60)
GLUCOSE: 118 mg/dL — AB (ref 65–99)
POTASSIUM: 3.9 mmol/L (ref 3.5–5.1)
Sodium: 137 mmol/L (ref 135–145)

## 2016-04-13 LAB — CBC
HEMATOCRIT: 39.8 % (ref 39.0–52.0)
HEMOGLOBIN: 13.1 g/dL (ref 13.0–17.0)
MCH: 27.9 pg (ref 26.0–34.0)
MCHC: 32.9 g/dL (ref 30.0–36.0)
MCV: 84.9 fL (ref 78.0–100.0)
PLATELETS: 221 10*3/uL (ref 150–400)
RBC: 4.69 MIL/uL (ref 4.22–5.81)
RDW: 13.5 % (ref 11.5–15.5)
WBC: 12.8 10*3/uL — AB (ref 4.0–10.5)

## 2016-04-13 MED ORDER — OXYCODONE-ACETAMINOPHEN 5-325 MG PO TABS: 1.0000 | ORAL_TABLET | ORAL | Status: DC | PRN

## 2016-04-13 MED ORDER — ALUM & MAG HYDROXIDE-SIMETH 200-200-20 MG/5ML PO SUSP
30.0000 mL | ORAL | Status: DC | PRN
  Administered 2016-04-13: 30 mL via ORAL
  Filled 2016-04-13: qty 30

## 2016-04-13 MED ORDER — FAMOTIDINE 20 MG PO TABS
20.0000 mg | ORAL_TABLET | Freq: Every day | ORAL | Status: DC
  Administered 2016-04-13 – 2016-04-14 (×2): 20 mg via ORAL
  Filled 2016-04-13 (×2): qty 1

## 2016-04-13 NOTE — Discharge Summary (Signed)
Physician Discharge Summary  Patient ID: John Blackwell MRN: [id number] DOB/AGE: [birth year] 46 y.o.  Admit date: 04/11/2016 Discharge date: 04/14/2016  Admission Diagnoses:  Acute appendicitis Rheumatoid arthritis on Azulfidine Hx of ETOH Herpes Simplex  Discharge Diagnoses:  Acute purulent ruptured appendicitis. 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 Marty Heck Course:  Pt presents with RLQ pain going to his groin, nausea and vomiting, trouble voiding. He reports some pain last week or longer that he attributed to a pulled muscle. It got better over the weekend, but after work yesterday he got severe pain around 7 PM last night. He developed nausea but says he didn't vomit. He has not eaten since 3 PM yesterday.  Work up in the ED shows hypertension, mild WBC elevation, UA is normal. CT scan shows: No free air or free fluid in the abdomen. Prominent distention of the appendix with periappendiceal infiltration and edema and at appendicolith. Changes are consistent with acute appendicitis. No discrete abscess. He is accompanied by his mother. He was admitted and taken to the OR later that day.  We found him to have a purulent rupture appendix.  Post op he had a rise in WBC and fever up to 103 for a large portion of the day.  He didn't really break it till early AM POD 2.  We kept him on IV antibiotics over the next 48 hours.  He felt better by the 3rd post op day.    His labs are better and he feels much better.  He will go home on 7 more days of oral antibiotics.  He had no issue with substance withdrawal during his hospitalization.    Condition on d/c:  Improved  CBC Latest Ref Rng 04/13/2016 04/12/2016 04/10/2016   WBC 4.0 - 10.5 K/uL 12.8(H) 18.9(H) 11.1(H)  Hemoglobin 13.0 - 17.0 g/dL 16.9 12.4(L) 13.7  Hematocrit 39.0 - 52.0 % 39.8 37.2(L) 41.7  Platelets 150 - 400 K/uL 221 219 260    CMP Latest Ref Rng 04/13/2016 04/12/2016 04/10/2016  Glucose 65 - 99 mg/dL 678(L) 381(O) 94  BUN 6 - 20 mg/dL 10 9 14   Creatinine 0.61 - 1.24 mg/dL 1.75 1.02  Sodium 135 - 145 mmol/L 137 138 140  Potassium 3.5 - 5.1 mmol/L 3.9 3.8 4.0  Chloride 101 - 111 mmol/L 107 105 107  CO2 22 - 32 mmol/L 24 26 22   Calcium 8.9 - 10.3 mg/dL 5.85) ) 9.9  Total Protein 6.5 - 8.1 g/dL - - 7.8  Total Bilirubin 0.3 - 1.2 mg/dL - - 0.8  Alkaline Phos 38 - 126 U/L - - 66  AST 15 - 41 U/L - - 20  ALT 17 - 63 U/L - - 27    Disposition: 01-Home or Self Care     Medication List    TAKE these medications        amoxicillin-clavulanate 875-125 MG tablet  Commonly known as:  AUGMENTIN  Take 1 tablet by mouth every 12 (twelve) hours.     ibuprofen 200 MG tablet  Commonly known as:  ADVIL,MOTRIN  Take 200 mg by mouth as needed for moderate pain.     multivitamin with minerals Tabs tablet  Get one from the drug store and take till your back to  normal baseline health.     naproxen 250 MG tablet  Commonly known as:  NAPROSYN  You can take this or the Ibuprofen, don't take both they are both similar drugs and can cause you problems.     oxyCODONE-acetaminophen 5-325 MG tablet  Commonly known as:  PERCOCET/ROXICET  Take 1-2 tablets by mouth every 4 (four) hours as needed for moderate pain.     saccharomyces boulardii 250 MG capsule  Commonly known as:  FLORASTOR  You can get this at any pharmacy over the counter.  Take it for 2 weeks beyond your last antibiotic pill.     sulfaSALAzine 500 MG EC tablet  Commonly known as:  AZULFIDINE  Take 2 tablets twice daily     valACYclovir 500 MG tablet  Commonly known as:  VALTREX  Take 1 tablet (500 mg total) by mouth daily.       Follow-up Information    Follow up  with [facility] On 04/26/2016.   Specialty:  General Surgery   Why:  Your appointment is at 11:15 AM, be at the office 30 minutes early for check in.   Contact information:   [address] [place] [address] [phone]       Signed: Sherrie George 04/14/2016, 11:30 AM  Agree with above.  Ovidio Kin, MD, [facility] Pager: [phone] Office phone:  [phone]

## 2016-04-13 NOTE — Progress Notes (Signed)
Notified Dr. Maisie Fus of [facility] about pt temperature of 103.1. Tylenol given, incentive spirometry encouraged, ice packs given. Ordered to give tylenol PO every 4 hours for 24 hours. Will continue to monitor.

## 2016-04-13 NOTE — Progress Notes (Signed)
2 Days Post-Op  Subjective: He is feeling better,but still pretty sore and weak.  Ate some of his breakfast.    Objective: Vital signs in last 24 hours: Temp:  [98.8 F (37.1 C)-103.1 F (39.5 C)] 99.3 F (37.4 C) (06/15 0540) Pulse Rate:  [80-115] 80 (06/15 0540) Resp:  [16-24] 20 (06/15 0540) BP: (122-152)/(77-87) 122/77 mmHg (06/15 0540) SpO2:  [94 %-99 %] 98 % (06/15 0540) Last BM Date: 04/12/16 420 PO Voided x 4 BM x 2 some very loose stools reported Temp down from 103/101 to 99.3 this AM VSS WBC dow to 12.8 this AM Intake/Output from previous day: 06/14 0701 - 06/15 0700 In: 3376.3 [P.O.:420; I.V.:2906.3; IV Piggyback:50] Out: -  Intake/Output this shift:    General appearance: alert, cooperative and no distress Resp: clear to auscultation bilaterally GI: soft, sore, few BS, but still hypoactive this AM.  Lab Results:   Recent Labs  04/12/16 0349 04/13/16 0411  WBC 18.9* 12.8*  HGB 12.4* 13.1  HCT 37.2* 39.8  PLT 219 221    BMET  Recent Labs  04/12/16 0349 04/13/16 0411  NA 138 137  K 3.8 3.9  CL 105 107  CO2 26 24  GLUCOSE 124* 118*  BUN 9 10  CREATININE 1.12 1.04  CALCIUM 8.6* 8.7*   PT/INR No results for input(s): LABPROT, INR in the last 72 hours.   Recent Labs Lab 04/10/16 2102  AST 20  ALT 27  ALKPHOS 66  BILITOT 0.8  PROT 7.8  ALBUMIN 4.5     Lipase     Component Value Date/Time   LIPASE 29 04/10/2016 2102     Studies/Results: No results found.  Medications: . acetaminophen  650 mg Oral Q4H  . famotidine (PEPCID) IV  20 mg Intravenous Q12H  . folic acid  1 mg Oral Daily  . heparin subcutaneous  5,000 Units Subcutaneous Q8H  . LORazepam  0-4 mg Intravenous Q12H  . multivitamin with minerals  1 tablet Oral Daily  . piperacillin-tazobactam (ZOSYN)  IV  3.375 g Intravenous Q8H  . saccharomyces boulardii  250 mg Oral BID  . thiamine  100 mg Oral Daily   Or  . thiamine  100 mg Intravenous Daily   Rheumatoid  arthritis, on Azulfidine, with Methotrexate ordered, but not started Hx of ETOH use Herpes Simplex  Assessment/Plan Acute purulent ruptured appendicitis S/P laparoscopic appendectomy, 6/13/38m Dr. Ezzard Standing Post op fever up to 103 most of day 04/12/16  -  Better this AM FEN: Clears/ advance as tolerated/continue IV fluids ID: Ceftriaxone/Flagyl - converted to Zosyn , Day 3/10 antibiotics.   Zosyn in hospital, will go home on Augmentin for a total of 10 days. DVT: SCD/Heparin   Plan:  Continue IV antibiotics and home tomorrow on oral abx.  Add PO percocet for pain.    LOS: 2 days    JENNINGS,WILLARD 04/13/2016 [phone]  Agree with above. He seems better. Less RLQ tenderness.  He vomited an omlet about 1 hours ago, but feels much better.  Ovidio Kin, MD, [facility] Pager: [phone] Office phone:  [phone]

## 2016-04-14 MED ORDER — AMOXICILLIN-POT CLAVULANATE 875-125 MG PO TABS
1.0000 | ORAL_TABLET | Freq: Two times a day (BID) | ORAL | Status: DC
  Administered 2016-04-14: 1 via ORAL
  Filled 2016-04-14 (×2): qty 1

## 2016-04-14 MED ORDER — OXYCODONE-ACETAMINOPHEN 5-325 MG PO TABS: 1.0000 | ORAL_TABLET | ORAL | Status: DC | PRN

## 2016-04-14 MED ORDER — AMOXICILLIN-POT CLAVULANATE 875-125 MG PO TABS: 1.0000 | ORAL_TABLET | Freq: Two times a day (BID) | ORAL | Status: DC

## 2016-04-14 MED ORDER — SACCHAROMYCES BOULARDII 250 MG PO CAPS: ORAL_CAPSULE | ORAL | Status: DC

## 2016-04-14 MED ORDER — ADULT MULTIVITAMIN W/MINERALS CH: ORAL_TABLET | ORAL | Status: DC

## 2016-04-14 MED ORDER — NAPROXEN 250 MG PO TABS: ORAL_TABLET | ORAL | Status: DC

## 2016-04-14 NOTE — Progress Notes (Signed)
RN reviewed patient discharge instructions with patient including, pain medication and antibiotic, signs and symptoms of infection, when to call MD, and follow up appointment.   All questions answered.   Paperwork and prescriptions given.   NT rolled patient down to family car with all belongings.

## 2016-04-14 NOTE — Progress Notes (Signed)
3 Days Post-Op  Subjective: He vomited his breakfast yesterday after noon, but has felt fine since then.  Still having some loose stools.  Anxious to go home.  Objective: Vital signs in last 24 hours: Temp:  [98 F (36.7 C)-99.5 F (37.5 C)] 98 F (36.7 C) (06/16 0520) Pulse Rate:  [88-92] 89 (06/16 0520) Resp:  [16-22] 16 (06/16 0520) BP: (107-137)/(74-94) 134/78 mmHg (06/16 0520) SpO2:  [98 %-99 %] 99 % (06/16 0520) Last BM Date: 04/13/16 480 PO Emesis 850 recorded Voided x 5 Afebrile, VSS WBC down to 12.8 H/H stable Intake/Output from previous day: 06/15 0701 - 06/16 0700 In: 2980 [P.O.:480; I.V.:2500] Out: 850 [Emesis/NG output:850] Intake/Output this shift:    General appearance: alert, cooperative and no distress Resp: clear to auscultation bilaterally GI: soft, sore, sites look fine.  Lab Results:   Recent Labs  04/12/16 0349 04/13/16 0411  WBC 18.9* 12.8*  HGB 12.4* 13.1  HCT 37.2* 39.8  PLT 219 221    BMET  Recent Labs  04/12/16 0349 04/13/16 0411  NA 138 137  K 3.8 3.9  CL 105 107  CO2 26 24  GLUCOSE 124* 118*  BUN 9 10  CREATININE 1.12 1.04  CALCIUM 8.6* 8.7*   PT/INR No results for input(s): LABPROT, INR in the last 72 hours.   Recent Labs Lab 04/10/16 2102  AST 20  ALT 27  ALKPHOS 66  BILITOT 0.8  PROT 7.8  ALBUMIN 4.5     Lipase     Component Value Date/Time   LIPASE 29 04/10/2016 2102     Studies/Results: No results found.  Medications: . famotidine  20 mg Oral Daily  . folic acid  1 mg Oral Daily  . heparin subcutaneous  5,000 Units Subcutaneous Q8H  . LORazepam  0-4 mg Intravenous Q12H  . multivitamin with minerals  1 tablet Oral Daily  . piperacillin-tazobactam (ZOSYN)  IV  3.375 g Intravenous Q8H  . saccharomyces boulardii  250 mg Oral BID  . thiamine  100 mg Oral Daily   Or  . thiamine  100 mg Intravenous Daily    Assessment/Plan Acute purulent ruptured appendicitis  S/P laparoscopic appendectomy,  04/11/16 - Dr. Ezzard Standing  Post op fever up to 103 most of day 04/12/16 - Better this AM FEN: Clears/ advance as tolerated/continue IV fluids ID: Ceftriaxone/Flagyl - converted to Zosyn , Day 3/10 antibiotics.Yesterday, home on Augmentin for 7 more days DVT: SCD/Heparin    Plan:  Home today on 7 more days of antibiotics.    LOS: 3 days   JENNINGS,WILLARD 04/14/2016 [phone]  Agree with above.  Ovidio Kin, MD, [facility] Pager: [phone] Office phone:  [phone]

## 2016-04-17 LAB — CULTURE, BLOOD (ROUTINE X 2)
Culture: NO GROWTH
Culture: NO GROWTH

## 2016-06-16 ENCOUNTER — Encounter (HOSPITAL_COMMUNITY): Payer: Self-pay

## 2016-08-28 ENCOUNTER — Telehealth: Payer: Self-pay | Admitting: Radiology

## 2016-08-28 ENCOUNTER — Other Ambulatory Visit: Payer: Self-pay | Admitting: Rheumatology

## 2016-08-28 NOTE — Telephone Encounter (Signed)
Patient due for labs for his SSZ  Last visit 05/23/16 Labs WNL 05/23/16 Have left message for patient to call back and let me know his plan for labs

## 2016-08-28 NOTE — Telephone Encounter (Signed)
Patient needs follow up appt with Dr Deveshwar pls call to make appt. Dec  

## 2016-08-28 NOTE — Telephone Encounter (Signed)
Patient due for labs for his SSZ  Last visit 05/23/16 Labs WNL 05/23/16 Have left message for patient to call back and let me know his plan for labs  Ok to refill one month supply until he can go for labs Per SD

## 2016-10-25 ENCOUNTER — Ambulatory Visit: Payer: BLUE CROSS/BLUE SHIELD | Admitting: Rheumatology

## 2017-02-07 ENCOUNTER — Ambulatory Visit (INDEPENDENT_AMBULATORY_CARE_PROVIDER_SITE_OTHER): Payer: BLUE CROSS/BLUE SHIELD | Admitting: Physician Assistant

## 2017-02-07 ENCOUNTER — Encounter: Payer: Self-pay | Admitting: Physician Assistant

## 2017-02-07 VITALS — BP 124/80 | HR 67 | Temp 97.5°F | Resp 16 | Wt 221.4 lb

## 2017-02-07 DIAGNOSIS — Z0000 Encounter for general adult medical examination without abnormal findings: Secondary | ICD-10-CM | POA: Diagnosis not present

## 2017-02-07 DIAGNOSIS — R739 Hyperglycemia, unspecified: Secondary | ICD-10-CM | POA: Diagnosis not present

## 2017-02-07 DIAGNOSIS — A6002 Herpesviral infection of other male genital organs: Secondary | ICD-10-CM | POA: Diagnosis not present

## 2017-02-07 LAB — HEMOGLOBIN A1C, FINGERSTICK: Hgb A1C (fingerstick): 5.5 % (ref <5.7)

## 2017-02-07 NOTE — Progress Notes (Signed)
Patient ID: John Blackwell MRN: [id number], DOB: May 30, 1971 46 y.o. Date of Encounter: 02/07/2017, 9:02 AM    Chief Complaint: CPE   COPIED FROM CPE NOTE 08/06/2013:  HPI: 46 y.o. y/o white male here to:  1- Establish care with our practice. He reports that he has had no type of medical evaluation in many years. Says he hasn't gone to an urgent care with a respiratory infection or anything in years.  2- does want to go ahead and do a complete physical exam and screening labs today. He notes that he did drink about 6 ounces of juice about 3 hours ago. I discussed that this may affect his cholesterol and glucose but he does not want have to return another day fasting so wants to go ahead and do all screening labs today.  3- has one specific concern that he wants evaluated today. He reports that over the past 2 months he has been having joint pain in different joints on different days.  Says that about 1-1/2 weeks ago the pain was in his hip. Last week he has significant pain in the left shoulder. The past 2 days he is having pain in the left foot. Today his left wrist is very painful and stiff with inflammation. Says that he can tell that his right knee is starting to hurt now. Says that at the time of each area of pain there is no abnormality on inspection. At the time of pain he sees no erythema or warmth. Just pain and stiffness and very minimal swelling secondary to inflammation and stiffness. He has no prior history of this type problem. He has no known family history of any type of similar problem. He has had no rashes and no fevers over the last couple months. He has had no known tick bites but does state that he does hunt and fish. He is taking over-the-counter equally arthritis medication over the last couple months with minimal improvement.  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  4- he  does report a history of genital herpes.  At that OV, reported significant alcohol use.  No other past medical history.  AT THAT OV: Checked labs that were c/w Rheumatoid Arthritis. Discussed the need to decrease alcohol consumption. Discussed effects on liver. Discussed setting a limit and goal of number of beers he is going to drink during the week and also on the weekend.   OV---10/14/2014:  Today patient says that he needs refills on his Valtrex. That he has continued to take it daily. Says that he has had no flares or episodes of herpes over the past year with taking this medication daily. Says that he is seeing Dr. Corliss Skains routinely.   OV/CPE 01/31/2016: Says that he is still seeing Dr. Corliss Skains for his rheumatoid arthritis. Says that he does need refill on his Valtrex. Says he has no updates regarding his medical history or family medical history. Still no premature family history of colon cancer or prostate cancer.  CPE 02/07/2017:  Only update in his medical history since his last visit with me is that he did have appendectomy 03/2016. No other medical updates today. States he has not seen Dr. Corliss Skains recently and is planning to call to schedule a follow-up visit with her. He is still taking the medication for RA. Says that the sulfasalazine seems to be working well for him. Reports that he has  decreased his alcohol and is drinking 2-4 beers about 2 times per week. He continues to take the Valtrex daily. No outbreak of herpes. No concerns to address today   Review of Systems: See HPI. All other systems were reviewed and are otherwise negative.  Past Medical History:  Diagnosis Date  . Alcohol abuse   . Arthritis 08/06/2013   OA of hands,feet, knees  . 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 RA (rheumatoid arthritis) (HCC)      Past Surgical History:  Procedure Laterality Date  . LAPAROSCOPIC APPENDECTOMY N/A 04/11/2016   Procedure: APPENDECTOMY LAPAROSCOPIC;  Surgeon:  Ovidio Kin, MD;  Location: [facility];  Service: General;  Laterality: N/A;    Home Meds:  Outpatient Medications Prior to Visit  Medication Sig Dispense Refill  . Multiple Vitamin (MULTIVITAMIN WITH MINERALS) TABS tablet Get one from the drug store and take till your back to normal baseline health.    . sulfaSALAzine (AZULFIDINE) 500 MG EC tablet TAKE 2 TABLETS BY MOUTH TWICE DAILY 120 tablet 0  . valACYclovir (VALTREX) 500 MG tablet Take 1 tablet (500 mg total) by mouth daily. 90 tablet 3  . amoxicillin-clavulanate (AUGMENTIN) 875-125 MG tablet Take 1 tablet by mouth every 12 (twelve) hours. 15 tablet 0  . ibuprofen (ADVIL,MOTRIN) 200 MG tablet Take 200 mg by mouth as needed for moderate pain.     . naproxen (NAPROSYN) 250 MG tablet You can take this or the Ibuprofen, don't take both they are both similar drugs and can cause you problems.    Marland Kitchen oxyCODONE-acetaminophen (PERCOCET/ROXICET) 5-325 MG tablet Take 1-2 tablets by mouth every 4 (four) hours as needed for moderate pain. 40 tablet 0  . saccharomyces boulardii (FLORASTOR) 250 MG capsule You can get this at any pharmacy over the counter.  Take it for 2 weeks beyond your last antibiotic pill.     No facility-administered medications prior to visit.      Allergies: No Known Allergies  History   Social History  . Marital Status: Single    Spouse Name: N/A    Number of Children: N/A  . Years of Education: N/A   Occupational History  . Gutter Work    Social History Main Topics  . Smoking status: Never Smoker   . Smokeless tobacco: Never Used  . Alcohol Use:     44 Cans of beer per week  . Drug Use: Not on file  . Sexual Activity: Yes   Other Topics Concern  . 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   Alcohol: 3-5 Beers each week day.                Friday Nights: 12 pack                Saturday: 12 pack                Sundays: None   Says that in past drank more. And also drank liquor  in past but rarely drinks any liquor now.      Never smoked.    Single.    Has Girlfriend.   Has lost 25 lbs in past year secondary to diet changes-stopped eating late night.(entered 07/2013)   Was lifting weights until past 2 months-stopped sec to pain.    No family history on file.  Physical Exam: Blood pressure 124/80, pulse 67, temperature 97.5 F (36.4 C), temperature source Oral, resp. rate 16,  weight 221 lb 6.4 oz (100.4 kg), SpO2 98 %.  General: Well developed, well nourished, WM. Appears in no acute distress. HEENT: BNormocephalic. Atraumatic. Ear canals are normal bilaterally. Tympanic membranes are normal bilaterally. Pharynx is normal with no erythema no exudate. Neck: Supple. Trachea midline. No thyromegaly. Full ROM. No lymphadenopathy. Lungs: Clear to auscultation bilaterally without wheezes, rales, or rhonchi. Breathing is of normal effort and unlabored. Cardiovascular: RRR with S1 S2. No murmurs, rubs, or gallops. Distal pulses 2+ symmetrically. No carotid or abdominal bruits. Abdomen: Normal bowel sounds throughout. No mass. No organomegaly. No tenderness. Musculoskeletal: Normal strength and tone for age. Skin: Right Flank: Approximate 1 inch diameter superficial mass. Mobile. No erythema.  Neuro: A+Ox3. CN II-XII grossly intact. Moves all extremities spontaneously.  Psych:  Responds to questions appropriately with a normal affect.   Assessment/Plan:  46 y.o. y/o  male here for    -1. Visit for preventive health examination  A. Screening Labs:  02/07/2017: He is not fasting today I reviewed labs from 2017:  - CBC with Differential---------------------------------------------Normal - COMPLETE METABOLIC PANEL WITH GFR------------Glucose Elevated----Add A1C and f/u that result - Lipid panel------------------------------------------------------------ok. HDL low. 02/07/2017---A1C was never done--Will check A1C now  B. Immunizations: Reviewed  02/07/2017---Tdap-----01/31/2016--Given here  Hyperglycemia 02/07/2017-- Hemoglobin A1C, fingerstick   RA (rheumatoid arthritis) 02/07/2017---Per Dr. Corliss Skains  Rheumatoid factor positive 02/07/2017---per Dr. Corliss Skains  Arthralgia of multiple sites 02/07/2017---Per Dr. Corliss Skains  Arthritis 02/07/2017-----------Per Dr. Corliss Skains  Alcohol abuse--h/o  02/07/2017-----Congrats on significant decrease in Alcohol reported at CPE 01/31/2016 and at CPE 02/07/2017 !!  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  Dispense: 90 tablet; Refill: 3    Signed:   [address] [place], [place]  02/07/2017 9:02 AM

## 2017-10-10 ENCOUNTER — Ambulatory Visit: Payer: BLUE CROSS/BLUE SHIELD | Admitting: Physician Assistant

## 2017-10-16 IMAGING — CT CT ABD-PELV W/ CM
2 of 5 series · 16 of 46 positions shown, 18 images · IV contrast (ISOVUE)
Comparison: None.

CLINICAL DATA: Right lower quadrant pain for 1 week, worse tonight.

EXAM:
CT ABDOMEN AND PELVIS WITH CONTRAST
TECHNIQUE: Multidetector CT imaging of the abdomen and pelvis was performed
using the standard protocol following bolus administration of
intravenous contrast.
CONTRAST:  100mL SALL25-TWW IOPAMIDOL (SALL25-TWW) INJECTION 61%

[Series 2: abd/pel with · axial · 0.80mm/px · z∈[+1104,+1540]mm · 13 of 99 slices shown, 15 images]
[im 6/99  soft-tissue]
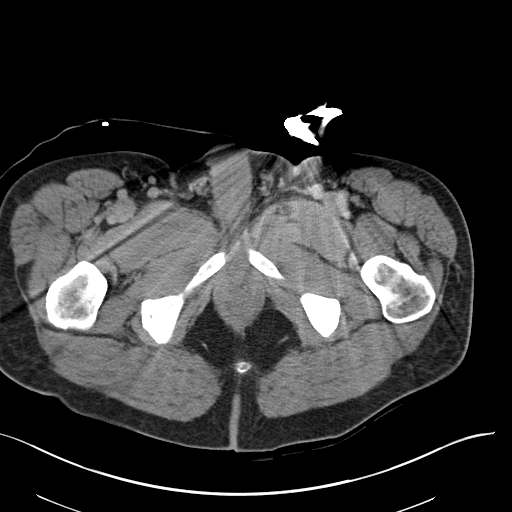
[im 6/99  bone]
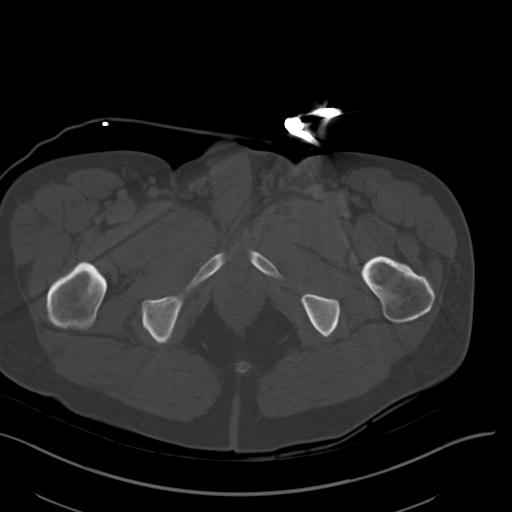
[im 12/99  soft-tissue]
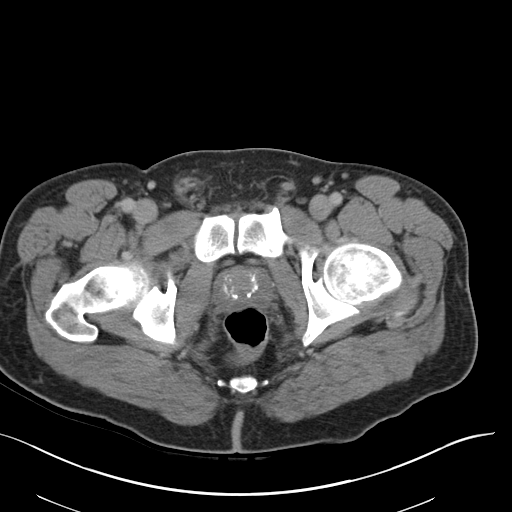
[im 24/99  soft-tissue]
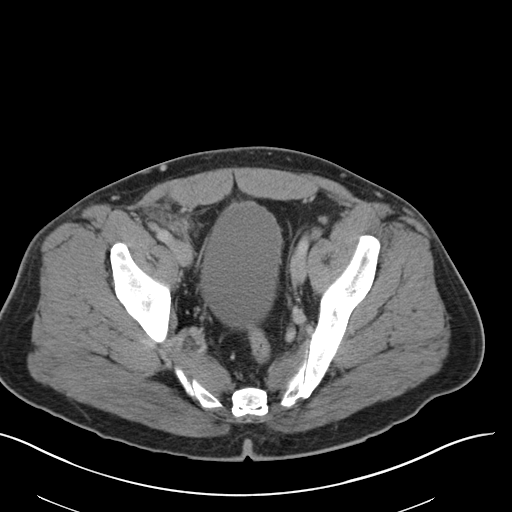
[im 29/99  soft-tissue]
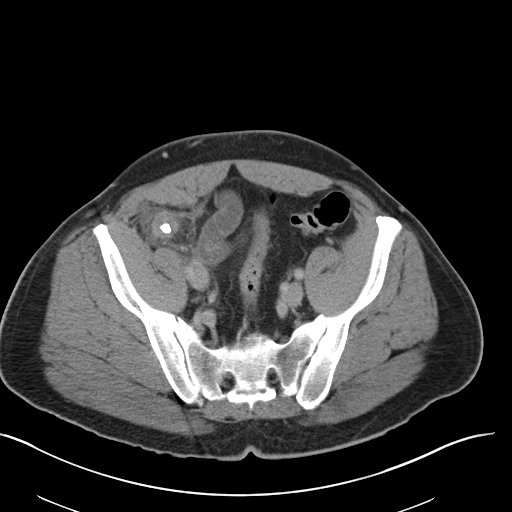
[im 35/99  soft-tissue]
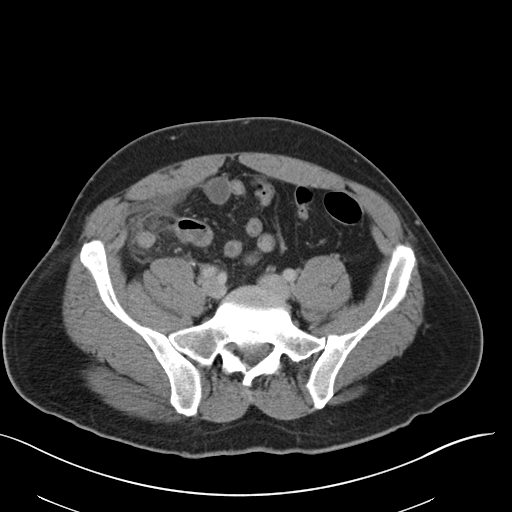
[im 41/99  soft-tissue]
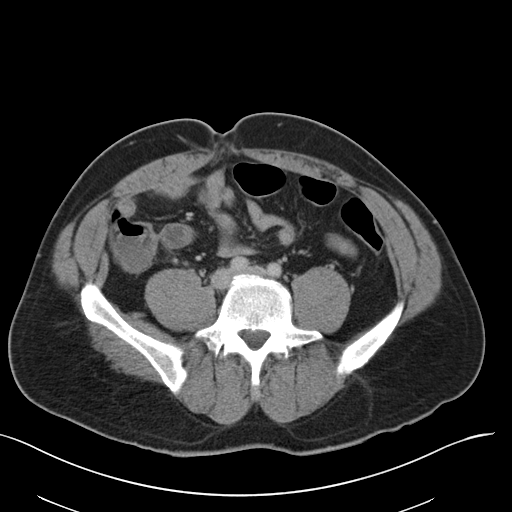
[im 52/99  soft-tissue]
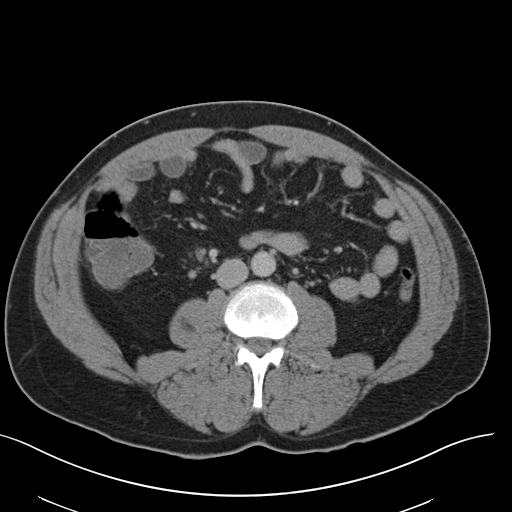
[im 58/99  soft-tissue]
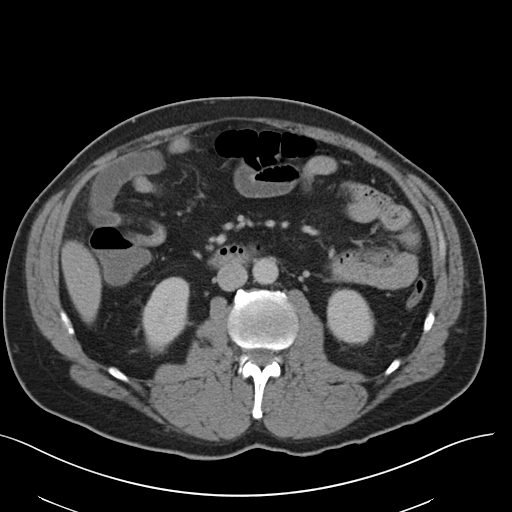
[im 64/99  soft-tissue]
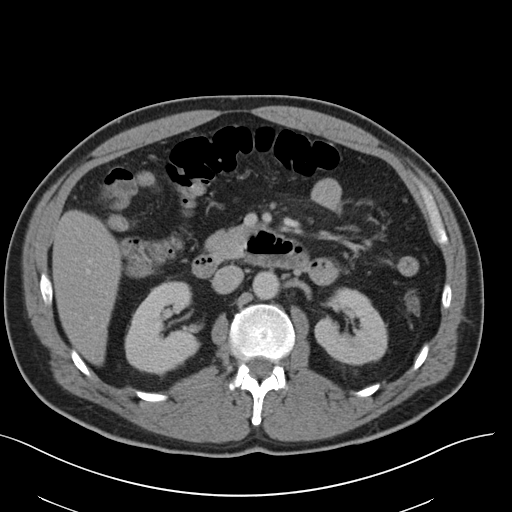
[im 64/99  bone]
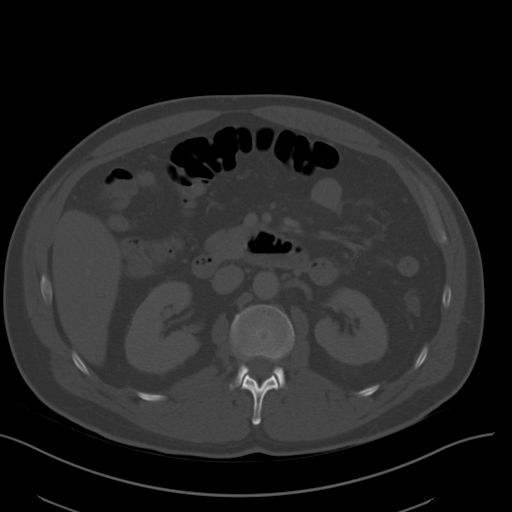
[im 70/99  soft-tissue]
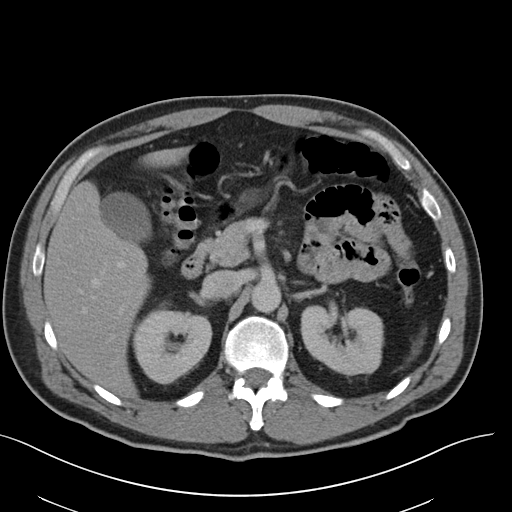
[im 75/99  soft-tissue]
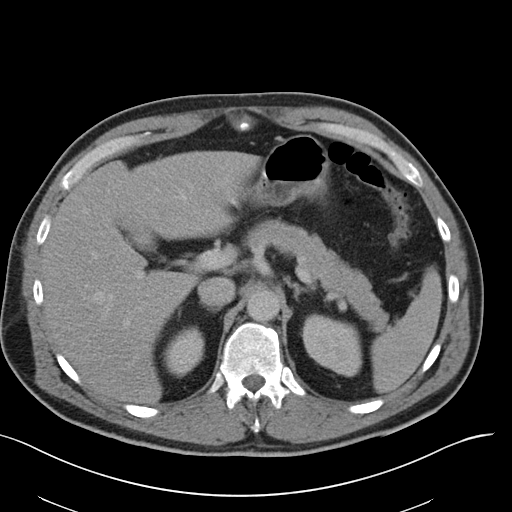
[im 87/99  soft-tissue]
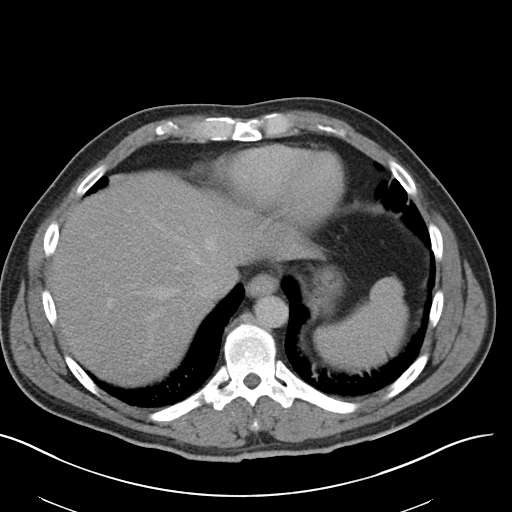
[im 93/99  soft-tissue]
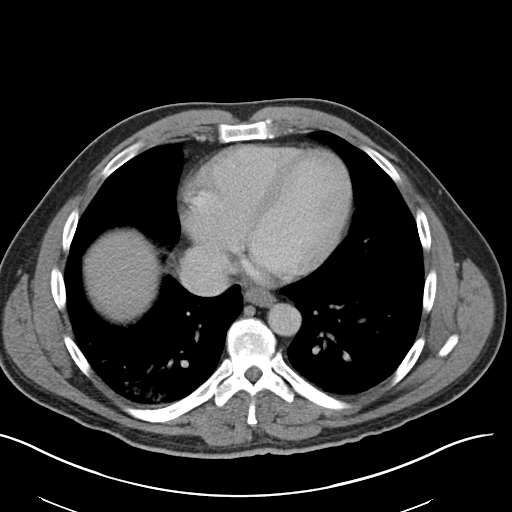

[Series 4: coronal a/|p · coronal · 0.74mm/px · 3 of 139 slices shown]
[im 47/139  soft-tissue]
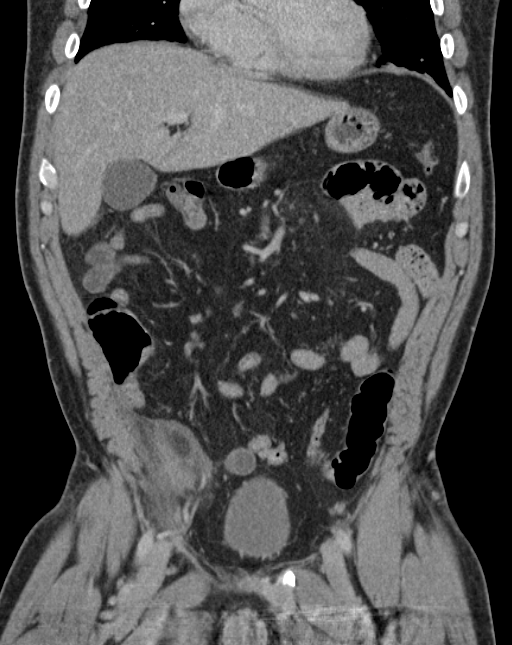
[im 62/139  soft-tissue]
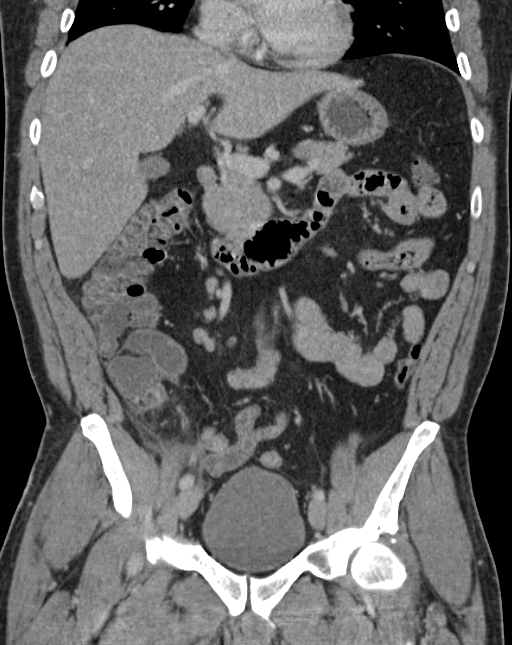
[im 77/139  soft-tissue]
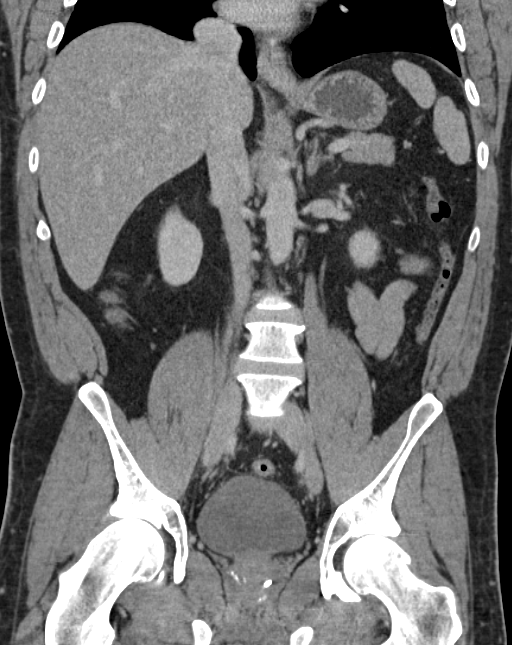

[16 of 46 positions shown; findings below may reference images not displayed]

FINDINGS: Atelectasis in the lung bases.

The liver, spleen, gallbladder, pancreas, adrenal glands, kidneys,
abdominal aorta, inferior vena cava, and retroperitoneal lymph nodes
are unremarkable. Stomach, small bowel, and colon are not abnormally
distended. No free air or free fluid in the abdomen.

Pelvis: At appendicolith measuring 8 mm diameter. Diffusely
distended appendix with appendiceal tip measuring up to about 2.7 cm
in diameter. Diffuse stranding in the fat around the appendix.
Changes are consistent with acute appendicitis. No abscess. Prostate
gland is not enlarged. Bladder wall is not thickened. Minimal free
fluid in the pelvis is likely reactive. No pelvic mass or
lymphadenopathy. Degenerative changes in the spine. No destructive
bone lesions.
IMPRESSION: Prominent distention of the appendix with periappendiceal
infiltration and edema and at appendicolith. Changes are consistent
with acute appendicitis. No discrete abscess.

## 2018-02-11 NOTE — Progress Notes (Signed)
Office Visit Note  Patient: John Blackwell             Date of Birth: 01-06-1971           MRN: [id number]             PCP: Deon Pilling Referring: Dorena Bodo, PA-C Visit Date: 02/13/2018 Occupation: @GUAROCC @    Subjective:  Pain in multiple joints  History of Present Illness: John Blackwell is a 47 y.o. male with history of seropositive rheumatoid arthritis.  Patient states that he has been taking 1 tablet of methotrexate daily since the end of 2017 when his insurance ran out.  He states that he was stockpiling his methotrexate.  He states that he has been off of sulfasalazine since the beginning 2018 due to running out of his prescription.  Patient states that he has been having increased pain in multiple joints.  He states that he is having significant pain and swelling in his left elbow for the past few months.  He states that he wears a compression sleeve on his left elbow while he is at work which helps with the discomfort.  He states he is also having pain in his bilateral second PIP joints.  He states he is also having discomfort in his left trochanteric bursa.  He denies any pain in his feet at this time.  He states that his joints have become more stiff especially in his hands.     Activities of Daily Living:  Patient reports morning stiffness for 30  minutes.   Patient Reports nocturnal pain.  Difficulty dressing/grooming: Denies Difficulty climbing stairs: Denies Difficulty getting out of chair: Denies Difficulty using hands for taps, buttons, cutlery, and/or writing: Denies   Review of Systems  Constitutional: Positive for fatigue. Negative for night sweats.  HENT: Negative for mouth sores, trouble swallowing, trouble swallowing, mouth dryness and nose dryness.   Eyes: Negative for redness and dryness.  Respiratory: Negative for cough, hemoptysis, shortness of breath and difficulty breathing.   Cardiovascular: Negative for chest pain, palpitations,  hypertension, irregular heartbeat and swelling in legs/feet.  Gastrointestinal: Negative for blood in stool, constipation and diarrhea.  Endocrine: Negative for increased urination.  Genitourinary: Negative for painful urination.  Musculoskeletal: Positive for arthralgias, joint pain, joint swelling and morning stiffness. Negative for myalgias, muscle weakness, muscle tenderness and myalgias.  Skin: Negative for color change, rash, hair loss, nodules/bumps, skin tightness, ulcers and sensitivity to sunlight.  Allergic/Immunologic: Negative for susceptible to infections.  Neurological: Negative for dizziness, fainting, memory loss, night sweats and weakness.  Hematological: Positive for swollen glands.  Psychiatric/Behavioral: Negative for depressed mood and sleep disturbance. The patient is not nervous/anxious.     PMFS History:  Patient Active Problem List   Diagnosis Date Noted  . Ruptured suppurative appendicitis 04/13/2016  . Right flank mass 10/14/2014  . RA (rheumatoid arthritis) (HCC) 08/27/2014  . Rheumatoid factor positive 12/19/2013  . Arthralgia of multiple sites 11/18/2013  . Arthritis 08/06/2013  . 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 Alcohol abuse     Past Medical History:  Diagnosis Date  . Alcohol abuse   . Arthritis 08/06/2013   OA of hands,feet, knees  . 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 RA (rheumatoid arthritis) (HCC)     Family History  Problem Relation Age of Onset  . Fibromyalgia Mother   . Thyroid disease Brother    Past Surgical History:  Procedure Laterality Date  .  LAPAROSCOPIC APPENDECTOMY N/A 04/11/2016   Procedure: APPENDECTOMY LAPAROSCOPIC;  Surgeon: Ovidio Kin, MD;  Location: [facility];  Service: General;  Laterality: N/A;   Social History   Social History Narrative   Does Gutter Work. Climbs Ladders.   Alcohol: 3-5 Beers each week day.                Friday Nights: 12 pack                Saturday: 12 pack                Sundays: None    Says that in past drank more. And also drank liquor in past but rarely drinks any liquor now.      Never smoked.    Single.    Has Girlfriend.   Has lost 25 lbs in past year secondary to diet changes-stopped eating late night.(entered 07/2013)   Was lifting weights until past 2 months-stopped sec to pain.     Objective: Vital Signs: BP 139/82 (BP Location: Left Arm, Patient Position: Sitting, Cuff Size: Normal)   Pulse 80   Resp 17   Ht 5\' 8"  (1.727 m)   Wt 244 lb (110.7 kg)   BMI 37.10 kg/m    Physical Exam  Constitutional: He is oriented to person, place, and time. He appears well-developed and well-nourished.  HENT:  Head: Normocephalic and atraumatic.  Eyes: Pupils are equal, round, and reactive to light. Conjunctivae and EOM are normal.  Neck: Normal range of motion. Neck supple.  Cardiovascular: Normal rate, regular rhythm and normal heart sounds.  Pulmonary/Chest: Effort normal and breath sounds normal.  Abdominal: Soft. Bowel sounds are normal.  Lymphadenopathy:    He has no cervical adenopathy.  Neurological: He is alert and oriented to person, place, and time.  Skin: Skin is warm and dry. Capillary refill takes less than 2 seconds.  Psychiatric: He has a normal mood and affect. His behavior is normal.  Nursing note and vitals reviewed.    Musculoskeletal Exam: C-spine, thoracic spine, lumbar spine good range of motion.  No midline spinal tenderness.  No SI joint tenderness.  He has limited forward flexion of his left shoulder.  He has a left elbow contracture.  Synovitis of his left elbow noted.  Wrist joints, MCPs, PIPs, DIPs good range of motion.  He has synovitis of his right second PIP and left second and third PIP joints.  Hip joints good range of motion.  He has left trochanteric bursitis.  Knee joints, ankle joints, MTPs, PIPs, DIPs good range of motion with no synovitis.  No warmth or effusion of bilateral knees.  CDAI Exam: CDAI Homunculus Exam:    Tenderness:  LUE: ulnohumeral and radiohumeral Right hand: 2nd PIP Left hand: 2nd PIP  Swelling:  LUE: ulnohumeral and radiohumeral Right hand: 2nd PIP Left hand: 2nd PIP and 3rd PIP  Joint Counts:  CDAI Tender Joint count: 3 CDAI Swollen Joint count: 4  Global Assessments:  Patient Global Assessment: 5 Provider Global Assessment: 5  CDAI Calculated Score: 17    Investigation: No additional findings. CBC Latest Ref Rng & Units 04/13/2016 04/12/2016 04/10/2016  WBC 4.0 - 10.5 K/uL 12.8(H) 18.9(H) 11.1(H)  Hemoglobin 13.0 - 17.0 g/dL 59.7 12.4(L) 13.7  Hematocrit 39.0 - 52.0 % 39.8 37.2(L) 41.7  Platelets 150 - 400 K/uL 221 219 260   CMP Latest Ref Rng & Units 04/13/2016 04/12/2016 04/10/2016  Glucose 65 - 99 mg/dL 416(L) 845(X) 94  BUN  6 - 20 mg/dL 10 9 14   Creatinine 0.61 - 1.24 mg/dL 1.94 1.74 0.81  Sodium 135 - 145 mmol/L 137 138 140  Potassium 3.5 - 5.1 mmol/L 3.9 3.8 4.0  Chloride 101 - 111 mmol/L 107 105 107  CO2 22 - 32 mmol/L 24 26 22   Calcium 8.9 - 10.3 mg/dL 4.4(Y) 1.8(H) 9.9  Total Protein 6.5 - 8.1 g/dL - - 7.8  Total Bilirubin 0.3 - 1.2 mg/dL - - 0.8  Alkaline Phos 38 - 126 U/L - - 66  AST 15 - 41 U/L - - 20  ALT 17 - 63 U/L - - 27    Imaging: No results found.  Speciality Comments: No specialty comments available.    Procedures:  No procedures performed Allergies: Patient has no known allergies.   Assessment / Plan:     Visit Diagnoses: Rheumatoid arthritis with rheumatoid factor of multiple sites without organ or systems involvement (HCC) - RF+ and CCP +last seen July 2017.  On exam he has a left elbow contracture with synovitis and he also synovitis of his right second PIP and left second and third PIP joints.  He has not had insurance since the end of 2017 so he has not been seen or have refills of his medications.  He has been taking methotrexate 1 tablet daily since 2017 and he ran out of sulfasalazine at the beginning of 2018.  We are going  to check his LFTs and hepatitis panel today in the office.  We also be checking a TB gold.  We will also obtain x-rays of his hands and feet.  X-rays of bilateral hands were consistent with rheumatoid arthritis and osteoarthritis overlap.  He has been having increased pain and stiffness in his joints.  He will be started on a prednisone taper starting at 20 mg, tapering by 5 mg every 4 days.  A prescription for prednisone was sent to the pharmacy.  I explained the indications contraindications and side effects of methotrexate.  I obtained consent today for methotrexate.  If labs are within normal limits we will start him on methotrexate 4 tablets once weekly and recheck labs in 2 weeks. If his labs are stable we will increase to 6 tablets once weekly and recheck labs in 2 weeks.  If his labs are stable he will continue on methotrexate 8 tablets once weekly.  He voiced understanding to this plan.  Folic acid 2 mg also be sent in with his prescription of methotrexate.- Plan: XR Hand 2 View Right, XR Hand 2 View Left, XR Foot 2 Views Right, XR Foot 2 Views Left, predniSONE (DELTASONE) 5 MG tablet  High risk medication use -CBC, CMP, TB gold and hepatitis panel were ordered today.  If labs are stable we will start him on methotrexate.- Plan: CBC with Differential/Platelet, COMPLETE METABOLIC PANEL WITH GFR, QuantiFERON-TB Gold Plus, Hepatitis B surface antigen, Hepatitis B core antibody, IgM, Hepatitis C antibody  Alcohol abuse: He was advised to avoid alcohol, especially when we restart him on methotrexate.  Pain in both hands -He has synovitis and tenderness of his right second PIP and left second and third PIP joints.  He has been having increased joint stiffness in his bilateral hands.  X-rays will be obtained today.  X-rays revealed findings consistent with rheumatoid arthritis and osteoarthritis overlap.  No erosions were noted.  Plan: XR Hand 2 View Right, XR Hand 2 View Left  Other fatigue -he has been  having worsening fatigue.  He  would like to check his TSH and Lyme titer.  Plan: TSH, B. 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 XR Hand 2 View Right  . XR Hand 2 View Left  . XR Foot 2 Views Right  . XR Foot 2 Views Left  . CBC with Differential/Platelet  . COMPLETE METABOLIC PANEL WITH GFR  . QuantiFERON-TB Gold Plus  . Hepatitis B surface antigen  . Hepatitis B core antibody, IgM  . Hepatitis C antibody  . TSH  . B. burgdorfi antibodies   Meds ordered this encounter  Medications  . predniSONE (DELTASONE) 5 MG tablet    Sig: Take 4 tablets by mouth for 4 days, 3 tablets for 4 days, 2 tablets for 4 days, and 1 tablet for 4 days    Dispense:  40 tablet    Refill:  0    Face-to-face time spent with patient was 30 minutes. >50% of time was spent in counseling and coordination of care.  Follow-Up Instructions: Return in about 5 months (around 07/16/2018) for Rheumatoid arthritis.   Gearldine Bienenstock, PA-C I examined and evaluated the patient with Sherron Ales PA.  He still has synovitis and some of his joints.  He is not been taking methotrexate as prescribed.  He was given a prednisone taper for flare.  The dosing was explained for methotrexate usage.  We will monitor for drug toxicity.  The plan of care was discussed as noted above.  Pollyann Savoy, MD Note - This record has been created using Animal nutritionist.  Chart creation errors have been sought, but may not always  have been located. Such creation errors do not reflect on  the standard of medical care.

## 2018-02-13 ENCOUNTER — Ambulatory Visit (INDEPENDENT_AMBULATORY_CARE_PROVIDER_SITE_OTHER): Payer: No Typology Code available for payment source | Admitting: Rheumatology

## 2018-02-13 ENCOUNTER — Ambulatory Visit (INDEPENDENT_AMBULATORY_CARE_PROVIDER_SITE_OTHER): Payer: Self-pay

## 2018-02-13 ENCOUNTER — Ambulatory Visit (INDEPENDENT_AMBULATORY_CARE_PROVIDER_SITE_OTHER): Payer: No Typology Code available for payment source

## 2018-02-13 ENCOUNTER — Encounter: Payer: Self-pay | Admitting: Physician Assistant

## 2018-02-13 VITALS — BP 139/82 | HR 80 | Resp 17 | Ht 68.0 in | Wt 244.0 lb

## 2018-02-13 DIAGNOSIS — M0579 Rheumatoid arthritis with rheumatoid factor of multiple sites without organ or systems involvement: Secondary | ICD-10-CM

## 2018-02-13 DIAGNOSIS — M79642 Pain in left hand: Secondary | ICD-10-CM

## 2018-02-13 DIAGNOSIS — A6002 Herpesviral infection of other male genital organs: Secondary | ICD-10-CM

## 2018-02-13 DIAGNOSIS — F1010 Alcohol abuse, uncomplicated: Secondary | ICD-10-CM

## 2018-02-13 DIAGNOSIS — Z79899 Other long term (current) drug therapy: Secondary | ICD-10-CM

## 2018-02-13 DIAGNOSIS — M79641 Pain in right hand: Secondary | ICD-10-CM

## 2018-02-13 DIAGNOSIS — R5383 Other fatigue: Secondary | ICD-10-CM

## 2018-02-13 MED ORDER — PREDNISONE 5 MG PO TABS: ORAL_TABLET | ORAL | 0 refills | Status: DC

## 2018-02-14 NOTE — Progress Notes (Signed)
Labs are WNL.

## 2018-02-15 LAB — CBC WITH DIFFERENTIAL/PLATELET
BASOS ABS: 38 {cells}/uL (ref 0–200)
Basophils Relative: 0.5 %
EOS ABS: 413 {cells}/uL (ref 15–500)
Eosinophils Relative: 5.5 %
HCT: 45.3 % (ref 38.5–50.0)
HEMOGLOBIN: 15.4 g/dL (ref 13.2–17.1)
Lymphs Abs: 1853 cells/uL (ref 850–3900)
MCH: 28.1 pg (ref 27.0–33.0)
MCHC: 34 g/dL (ref 32.0–36.0)
MCV: 82.5 fL (ref 80.0–100.0)
MONOS PCT: 7 %
MPV: 10.9 fL (ref 7.5–12.5)
Neutro Abs: 4673 cells/uL (ref 1500–7800)
Neutrophils Relative %: 62.3 %
PLATELETS: 228 10*3/uL (ref 140–400)
RBC: 5.49 10*6/uL (ref 4.20–5.80)
RDW: 12.9 % (ref 11.0–15.0)
Total Lymphocyte: 24.7 %
WBC mixed population: 525 cells/uL (ref 200–950)
WBC: 7.5 10*3/uL (ref 3.8–10.8)

## 2018-02-15 LAB — COMPLETE METABOLIC PANEL WITH GFR
AG RATIO: 2 (calc) (ref 1.0–2.5)
ALT: 46 U/L (ref 9–46)
AST: 18 U/L (ref 10–40)
Albumin: 4.8 g/dL (ref 3.6–5.1)
Alkaline phosphatase (APISO): 61 U/L (ref 40–115)
BILIRUBIN TOTAL: 0.5 mg/dL (ref 0.2–1.2)
BUN: 14 mg/dL (ref 7–25)
CHLORIDE: 105 mmol/L (ref 98–110)
CO2: 27 mmol/L (ref 20–32)
Calcium: 10 mg/dL (ref 8.6–10.3)
Creat: 0.98 mg/dL (ref 0.60–1.35)
GFR, EST AFRICAN AMERICAN: 106 mL/min/{1.73_m2} (ref >=60)
GFR, Est Non African American: 91 mL/min/{1.73_m2} (ref >=60)
Globulin: 2.4 g/dL (calc) (ref 1.9–3.7)
Glucose, Bld: 92 mg/dL (ref 65–99)
POTASSIUM: 4.3 mmol/L (ref 3.5–5.3)
Sodium: 138 mmol/L (ref 135–146)
Total Protein: 7.2 g/dL (ref 6.1–8.1)

## 2018-02-15 LAB — QUANTIFERON-TB GOLD PLUS
NIL: 0.03 [IU]/mL
QUANTIFERON-TB GOLD PLUS: NEGATIVE
TB2-NIL: <0 IU/mL

## 2018-02-15 LAB — TSH: TSH: 0.7 mIU/L (ref 0.40–4.50)

## 2018-02-15 LAB — HEPATITIS B SURFACE ANTIGEN: Hepatitis B Surface Ag: NONREACTIVE

## 2018-02-15 LAB — B. BURGDORFI ANTIBODIES: B burgdorferi Ab IgG+IgM: <0.9 index

## 2018-02-15 LAB — HEPATITIS C ANTIBODY
HEP C AB: NONREACTIVE
SIGNAL TO CUT-OFF: 0.03 (ref <1.00)

## 2018-02-15 LAB — HEPATITIS B CORE ANTIBODY, IGM: HEP B C IGM: NONREACTIVE

## 2018-02-21 ENCOUNTER — Ambulatory Visit (INDEPENDENT_AMBULATORY_CARE_PROVIDER_SITE_OTHER): Payer: No Typology Code available for payment source | Admitting: Physician Assistant

## 2018-02-21 ENCOUNTER — Other Ambulatory Visit: Payer: Self-pay

## 2018-02-21 ENCOUNTER — Encounter: Payer: Self-pay | Admitting: Physician Assistant

## 2018-02-21 VITALS — BP 118/78 | HR 64 | Temp 98.0°F | Ht 68.0 in | Wt 239.5 lb

## 2018-02-21 DIAGNOSIS — Z0000 Encounter for general adult medical examination without abnormal findings: Secondary | ICD-10-CM

## 2018-02-21 MED ORDER — VALACYCLOVIR HCL 500 MG PO TABS: 500.0000 mg | ORAL_TABLET | Freq: Every day | ORAL | 3 refills | Status: DC

## 2018-02-21 NOTE — Progress Notes (Signed)
Patient ID: John Blackwell MRN: [id number], DOB: 02-May-1971 47 y.o. Date of Encounter: 02/21/2018, 8:47 AM    Chief Complaint: CPE   COPIED FROM CPE NOTE 08/06/2013:  HPI: 47 y.o. y/o white male here to:  1- Establish care with our practice. He reports that he has had no type of medical evaluation in many years. Says he hasn't gone to an urgent care with a respiratory infection or anything in years.  2- does want to go ahead and do a complete physical exam and screening labs today. He notes that he did drink about 6 ounces of juice about 3 hours ago. I discussed that this may affect his cholesterol and glucose but he does not want have to return another day fasting so wants to go ahead and do all screening labs today.  3- has one specific concern that he wants evaluated today. He reports that over the past 2 months he has been having joint pain in different joints on different days.  Says that about 1-1/2 weeks ago the pain was in his hip. Last week he has significant pain in the left shoulder. The past 2 days he is having pain in the left foot. Today his left wrist is very painful and stiff with inflammation. Says that he can tell that his right knee is starting to hurt now. Says that at the time of each area of pain there is no abnormality on inspection. At the time of pain he sees no erythema or warmth. Just pain and stiffness and very minimal swelling secondary to inflammation and stiffness. He has no prior history of this type problem. He has no known family history of any type of similar problem. He has had no rashes and no fevers over the last couple months. He has had no known tick bites but does state that he does hunt and fish. He is taking over-the-counter equally arthritis medication over the last couple months with minimal improvement.  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  4- he  does report a history of genital herpes.  At that OV, reported significant alcohol use.  No other past medical history.  AT THAT OV: Checked labs that were c/w Rheumatoid Arthritis. Discussed the need to decrease alcohol consumption. Discussed effects on liver. Discussed setting a limit and goal of number of beers he is going to drink during the week and also on the weekend.   OV---10/14/2014:  Today patient says that he needs refills on his Valtrex. That he has continued to take it daily. Says that he has had no flares or episodes of herpes over the past year with taking this medication daily. Says that he is seeing Dr. Corliss Skains routinely.   OV/CPE 01/31/2016: Says that he is still seeing Dr. Corliss Skains for his rheumatoid arthritis. Says that he does need refill on his Valtrex. Says he has no updates regarding his medical history or family medical history. Still no premature family history of colon cancer or prostate cancer.  CPE 02/07/2017:  Only update in his medical history since his last visit with me is that he did have appendectomy 03/2016. No other medical updates today. States he has not seen Dr. Corliss Skains recently and is planning to call to schedule a follow-up visit with her. He is still taking the medication for RA. Says that the sulfasalazine seems to be working well for him. Reports that he has  decreased his alcohol and is drinking 2-4 beers about 2 times per week. He continues to take the Valtrex daily. No outbreak of herpes. No concerns to address today   CPE 02/21/2018: He reports that the insurance plan he had was too expensive so he was without insurance for about 4 or 5 months but then got back on a new insurance plan recently.  He is self-employed. Recently did follow-up with Dr. Jon Billings and says that she prescribed prednisone and got him back on methotrexate. He says that she did a lot of lab work just recently. Reports that he has kept his alcohol down to just a  few beers. Reports that he does need refill on his Valtrex.  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 No specific concerns to address today. Reports that he has had no new medical updates over the past year --- other than that period of time of having no insurance.  Still no family history of  premature CAD or CA.     Review of Systems: See HPI. All other systems were reviewed and are otherwise negative.  Past Medical History:  Diagnosis Date  . Alcohol abuse   . Arthritis 08/06/2013   OA of hands,feet, knees  . 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 RA (rheumatoid arthritis) (HCC)      Past Surgical History:  Procedure Laterality Date  . LAPAROSCOPIC APPENDECTOMY N/A 04/11/2016   Procedure: APPENDECTOMY LAPAROSCOPIC;  Surgeon: Ovidio Kin, MD;  Location: [facility];  Service: General;  Laterality: N/A;    Home Meds:  Outpatient Medications Prior to Visit  Medication Sig Dispense Refill  . Acetylcysteine (N-ACETYL-L-CYSTEINE PO) Take 500 mg by mouth 2 (two) times a week.    . Ascorbic Acid (VITAMIN C) 1000 MG tablet Take 1,000 mg by mouth 2 (two) times a week.    . B Complex-C (SUPER B COMPLEX PO) Take by mouth 2 (two) times a week.    . Cholecalciferol (VITAMIN D3) 5000 units CAPS Take by mouth 2 (two) times a week.    . [employer] TRIPLE MAGNESIUM COMPLEX PO Take 800 mg by mouth 2 (two) times a week.    . L-Lysine 500 MG TABS Take by mouth 2 (two) times a week.    . Multiple Vitamin (MULTIVITAMIN WITH MINERALS) TABS tablet Get one from the drug store and take till your back to normal baseline health.    . predniSONE (DELTASONE) 5 MG tablet Take 4 tablets by mouth for 4 days, 3 tablets for 4 days, 2 tablets for 4 days, and 1 tablet for 4 days 40 tablet 0  . pyridOXINE (VITAMIN B-6) 100 MG tablet Take 100 mg by mouth 2 (two) times a week.    . Selenium 200 MCG CAPS Take by mouth 2 (two) times a week.    . thiamine (VITAMIN B-1) 100 MG tablet Take 100 mg by mouth 2 (two) times a week.    .  valACYclovir (VALTREX) 500 MG tablet Take 1 tablet (500 mg total) by mouth daily. 90 tablet 3  . vitamin A 43154 UNIT capsule Take 10,000 Units by mouth 2 (two) times a week.    . vitamin B-12 (CYANOCOBALAMIN) 1000 MCG tablet Take 1,000 mcg by mouth 2 (two) times a week.    . vitamin E 1000 UNIT capsule Take 1,000 Units by mouth 2 (two) times a week.    . methotrexate (RHEUMATREX) 2.5 MG tablet Take 2.5 mg by mouth daily. Caution:Chemotherapy. Protect from light.  No facility-administered medications prior to visit.      Allergies: No Known Allergies  History   Social History  . Marital Status: Single    Spouse Name: N/A    Number of Children: N/A  . Years of Education: N/A   Occupational History  . Gutter Work    Social History Main Topics  . Smoking status: Never Smoker   . Smokeless tobacco: Never Used  . Alcohol Use:     44 Cans of beer per week  . Drug Use: Not on file  . Sexual Activity: Yes   Other Topics Concern  . 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   Alcohol: 3-5 Beers each week day.                Friday Nights: 12 pack                Saturday: 12 pack                Sundays: None   Says that in past drank more. And also drank liquor in past but rarely drinks any liquor now.      Never smoked.    Single.    Has Girlfriend.   Has lost 25 lbs in past year secondary to diet changes-stopped eating late night.(entered 07/2013)   Was lifting weights until past 2 months-stopped sec to pain.    Family History  Problem Relation Age of Onset  . Fibromyalgia Mother   . Thyroid disease Brother     Physical Exam: Blood pressure 118/78, pulse 64, temperature 98 F (36.7 C), temperature source Oral, height 5\' 8"  (1.727 m), weight 108.6 kg (239 lb 8 oz), SpO2 97 %.  General: Well developed, well nourished, WM. Appears in no acute distress. HEENT: Normocephalic. Atraumatic. Ear canals are normal bilaterally. Tympanic  membranes are normal bilaterally. Pharynx is normal with no erythema no exudate. Neck: Supple. Trachea midline. No thyromegaly. Full ROM. No lymphadenopathy.  No carotid bruit. Lungs: Clear to auscultation bilaterally without wheezes, rales, or rhonchi. Breathing is of normal effort and unlabored. Cardiovascular: RRR with S1 S2. No murmurs, rubs, or gallops. Distal pulses 2+ symmetrically. No carotid or abdominal bruits. Abdomen: Normal bowel sounds throughout. No mass. No organomegaly. No tenderness. Musculoskeletal: Normal strength and tone for age. Skin: Right Flank: Approximate 1 inch diameter superficial mass. Mobile. No erythema.  Neuro: A+Ox3. CN II-XII grossly intact. Moves all extremities spontaneously.  Psych:  Responds to questions appropriately with a normal affect.   Assessment/Plan:  47 y.o. y/o  male here for    -1. Visit for preventive health examination  A. Screening Labs:   02/21/2018: Recent lab results ordered by Dr. Corliss Skains are in epic for me to review.  These were just performed 02/13/18 -- just last week.   TSH was normal.  CBC was normal.  CMET was normal.  Glucose was normal at 92. Reviewed that lab 01/2017 A1c was checked and was normal at 5.5. Reviewed the last lipid panel was checked here in 2017 and was good.  Discussed with patient that the only lab due today would be a lipid panel.  Reports that he has not had any significant change in his diet-- does not think diet is significantly worse compared to 2 years ago.  Discussed that I am happy to check a lipid panel today if he wants to, but  from a practical standpoint probably not necessary.  Defers to check this today.  States that he will return in 1 year and will recheck labs at that visit.   B. Immunizations: Reviewed 02/07/2017---Tdap-----01/31/2016--Given here     RA (rheumatoid arthritis) 02/21/2018 --- Per Dr. Corliss Skains  Rheumatoid factor positive 02/21/2018 ---per Dr. Corliss Skains  Arthralgia of  multiple sites 02/21/2018 ---Per Dr. Corliss Skains  Arthritis 02/21/2018 -----------Per Dr. Corliss Skains  Alcohol abuse--h/o  02/21/2018-----Congrats on significant decrease in Alcohol reported at CPE 01/31/2016 and at CPE 02/07/2017 and 01/2018 !!  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   ValACYclovir (VALTREX) 500 MG tablet; Take 1 tablet (500 mg total) by mouth daily.  Dispense: 90 tablet; Refill: 3    Signed:   [address] [place], [place]  02/21/2018 8:47 AM

## 2018-02-25 ENCOUNTER — Encounter: Payer: Self-pay | Admitting: Physician Assistant

## 2018-03-19 ENCOUNTER — Telehealth: Payer: Self-pay | Admitting: Rheumatology

## 2018-03-19 NOTE — Telephone Encounter (Signed)
Patient called stating that he has just finished his 2 week cycle of Prednisone.  Patient states he went to [facility] to pick up his prescription of Methotrexate, but the pharmacy didn't have it ready yet.  Patient is r

## 2018-03-22 ENCOUNTER — Telehealth: Payer: Self-pay | Admitting: Rheumatology

## 2018-03-22 MED ORDER — METHOTREXATE 2.5 MG PO TABS: ORAL_TABLET | ORAL | 2 refills | Status: DC

## 2018-03-22 MED ORDER — FOLIC ACID 1 MG PO TABS: 2.0000 mg | ORAL_TABLET | Freq: Every day | ORAL | 3 refills | Status: DC

## 2018-03-22 NOTE — Telephone Encounter (Signed)
Patient called stating that he has just finished his 2 week cycle of Prednisone.  Patient states he went to [facility] to pick up his prescription of Methotrexate, but the pharmacy didn't have it ready yet.

## 2018-03-22 NOTE — Telephone Encounter (Signed)
Per last office note 4 tablets once weekly and recheck labs in 2 weeks. If his labs are stable we will increase to 6 tablets once weekly and recheck labs in 2 weeks.  If his labs are stable he will continue on methotrexate 8 tablets once weekly.  He voiced understanding to this plan.  Folic acid 2 mg also be sent in with his prescription of methotrexate.  Left message to advise patient prescription has been sent to the pharmacy.

## 2018-04-11 ENCOUNTER — Encounter: Payer: Self-pay | Admitting: Physician Assistant

## 2018-04-29 ENCOUNTER — Other Ambulatory Visit: Payer: Self-pay | Admitting: *Deleted

## 2018-04-29 DIAGNOSIS — Z79899 Other long term (current) drug therapy: Secondary | ICD-10-CM

## 2018-04-29 LAB — CBC WITH DIFFERENTIAL/PLATELET
BASOS ABS: 57 {cells}/uL (ref 0–200)
Basophils Relative: 1 %
Eosinophils Absolute: 359 cells/uL (ref 15–500)
Eosinophils Relative: 6.3 %
HCT: 46.2 % (ref 38.5–50.0)
Hemoglobin: 15.9 g/dL (ref 13.2–17.1)
Lymphs Abs: 1932 cells/uL (ref 850–3900)
MCH: 28.9 pg (ref 27.0–33.0)
MCHC: 34.4 g/dL (ref 32.0–36.0)
MCV: 83.8 fL (ref 80.0–100.0)
MONOS PCT: 8.4 %
MPV: 10.7 fL (ref 7.5–12.5)
NEUTROS PCT: 50.4 %
Neutro Abs: 2873 cells/uL (ref 1500–7800)
PLATELETS: 227 10*3/uL (ref 140–400)
RBC: 5.51 10*6/uL (ref 4.20–5.80)
RDW: 13.7 % (ref 11.0–15.0)
TOTAL LYMPHOCYTE: 33.9 %
WBC mixed population: 479 cells/uL (ref 200–950)
WBC: 5.7 10*3/uL (ref 3.8–10.8)

## 2018-04-29 LAB — COMPLETE METABOLIC PANEL WITH GFR
AG RATIO: 2 (calc) (ref 1.0–2.5)
ALT: 78 U/L — ABNORMAL HIGH (ref 9–46)
AST: 29 U/L (ref 10–40)
Albumin: 4.9 g/dL (ref 3.6–5.1)
Alkaline phosphatase (APISO): 53 U/L (ref 40–115)
BILIRUBIN TOTAL: 0.5 mg/dL (ref 0.2–1.2)
BUN: 20 mg/dL (ref 7–25)
CALCIUM: 10 mg/dL (ref 8.6–10.3)
CHLORIDE: 103 mmol/L (ref 98–110)
CO2: 27 mmol/L (ref 20–32)
Creat: 1.17 mg/dL (ref 0.60–1.35)
GFR, EST AFRICAN AMERICAN: 86 mL/min/{1.73_m2} (ref >=60)
GFR, Est Non African American: 74 mL/min/{1.73_m2} (ref >=60)
GLOBULIN: 2.5 g/dL (ref 1.9–3.7)
Glucose, Bld: 95 mg/dL (ref 65–99)
POTASSIUM: 4.4 mmol/L (ref 3.5–5.3)
SODIUM: 138 mmol/L (ref 135–146)
Total Protein: 7.4 g/dL (ref 6.1–8.1)

## 2018-04-30 ENCOUNTER — Telehealth: Payer: Self-pay | Admitting: Physician Assistant

## 2018-04-30 NOTE — Telephone Encounter (Signed)
I attempted to call patient to discuss his lab work that was drawn yesterday.  ALT was 78.  All other labs were WNL.  He was recently started on MTX.  We will need to discuss another treatment option that does not cause elevated LFTs.  I discussed this with Dr. Corliss Skains, and she would like to discuss starting him on Enbrel 50 mg subcutaneous injections once weekly.  He will need a return office visit to discuss indications, contraindications, and side effects.  Consent will need to be obtained.  Depending on his current dose of MTX we will need to either decrease the dose or discontinue.

## 2018-05-01 NOTE — Progress Notes (Signed)
Office Visit Note  Patient: John Blackwell             Date of Birth: 03-Aug-1971           MRN: [id number]             PCP: Deon Pilling Referring: Dorena Bodo, PA-C Visit Date: 05/15/2018 Occupation: @GUAROCC @    Subjective:  Joint stiffness   History of Present Illness: John Blackwell is a 47 y.o. male with history of seropositive rheumatoid arthritis.  Patient has been on Methotrexate 8 tablets by mouth once a week and folic acid 2 mg daily.  He reports no significant improvement when starting on methotrexate.  He states he noticed the maximum benefit when he was taking tablets by mouth once weekly.  His last dose of methotrexate was this past weekend.  He reports he continues to have bilateral hand pain and stiffness.  He denies any joint swelling.   Activities of Daily Living:  Patient reports morning stiffness for 5  minutes.   Patient Reports nocturnal pain.  Difficulty dressing/grooming: Denies Difficulty climbing stairs: Denies Difficulty getting out of chair: Denies Difficulty using hands for taps, buttons, cutlery, and/or writing: Denies   Review of Systems  Constitutional: Negative for fatigue and night sweats.  HENT: Negative for mouth sores, trouble swallowing, trouble swallowing, mouth dryness and nose dryness.   Eyes: Negative for redness, visual disturbance and dryness.  Respiratory: Negative for cough, hemoptysis, shortness of breath and difficulty breathing.   Cardiovascular: Negative for chest pain, palpitations, hypertension, irregular heartbeat and swelling in legs/feet.  Gastrointestinal: Negative for blood in stool, constipation and diarrhea.  Endocrine: Negative for increased urination.  Genitourinary: Negative for painful urination.  Musculoskeletal: Positive for arthralgias, joint pain and morning stiffness. Negative for joint swelling, myalgias, muscle weakness, muscle tenderness and myalgias.  Skin: Negative for color change, rash,  hair loss, nodules/bumps, skin tightness, ulcers and sensitivity to sunlight.  Allergic/Immunologic: Negative for susceptible to infections.  Neurological: Negative for dizziness, fainting, memory loss, night sweats and weakness.  Hematological: Negative for swollen glands.  Psychiatric/Behavioral: Negative for depressed mood and sleep disturbance. The patient is not nervous/anxious.     PMFS History:  Patient Active Problem List   Diagnosis Date Noted  . Ruptured suppurative appendicitis 04/13/2016  . Right flank mass 10/14/2014  . RA (rheumatoid arthritis) (HCC) 08/27/2014  . Rheumatoid factor positive 12/19/2013  . Arthralgia of multiple sites 11/18/2013  . Arthritis 08/06/2013  . 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 Alcohol abuse     Past Medical History:  Diagnosis Date  . Alcohol abuse   . Arthritis 08/06/2013   OA of hands,feet, knees  . 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 RA (rheumatoid arthritis) (HCC)     Family History  Problem Relation Age of Onset  . Fibromyalgia Mother   . Thyroid disease Brother   . Healthy Daughter    Past Surgical History:  Procedure Laterality Date  . 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   Alcohol: 3-5 Beers each week day.                Friday Nights: 12 pack                Saturday:  12 pack                Sundays: None   Says that in past drank more. And also drank liquor in past but rarely drinks any liquor now.      Never smoked.    Single.    Has Girlfriend.   Has lost 25 lbs in past year secondary to diet changes-stopped eating late night.(entered 07/2013)   Was lifting weights until past 2 months-stopped sec to pain.     Objective: Vital Signs: BP 122/80 (BP Location: Left Arm, Patient Position: Sitting, Cuff Size:  Normal)   Pulse 88   Resp 15   Ht 5\' 8"  (1.727 m)   Wt 241 lb (109.3 kg)   BMI 36.64 kg/m    Physical Exam  Constitutional: He is oriented to person, place, and time. He appears well-developed and well-nourished.  HENT:  Head: Normocephalic and atraumatic.  Eyes: Pupils are equal, round, and reactive to light. Conjunctivae and EOM are normal.  Neck: Normal range of motion. Neck supple.  Cardiovascular: Normal rate, regular rhythm and normal heart sounds.  Pulmonary/Chest: Effort normal and breath sounds normal.  Abdominal: Soft. Bowel sounds are normal.  Lymphadenopathy:    He has no cervical adenopathy.  Neurological: He is alert and oriented to person, place, and time.  Skin: Skin is warm and dry. Capillary refill takes less than 2 seconds.  Psychiatric: He has a normal mood and affect. His behavior is normal.  Nursing note and vitals reviewed.    Musculoskeletal Exam: C-spine, thoracic spine, and lumbar spine good ROM.  No midline spinal tenderness.  No SI joint tenderness.  Shoulder joints, elbow joints, wrist joints, MCPs, PIPs, and DIPs good ROM with no synovitis or tenderness.  Complete fist formation bilaterally.  Hip joints, knee joints, ankle joints, MTPs, PIPs, and DIPs good ROM with no synovitis.  No warmth or effusion of knee joints.   CDAI Exam: CDAI Homunculus Exam:   Joint Counts:  CDAI Tender Joint count: 0 CDAI Swollen Joint count: 0  Global Assessments:  Patient Global Assessment: 2 Provider Global Assessment: 2  CDAI Calculated Score: 4    Investigation: No additional findings. CBC Latest Ref Rng & Units 04/29/2018 02/13/2018 04/13/2016  WBC 3.8 - 10.8 Thousand/uL 5.7 7.5 12.8(H)  Hemoglobin 13.2 - 17.1 g/dL 27.6 14.7 09.2  Hematocrit 38.5 - 50.0 % 46.2 45.3 39.8  Platelets 140 - 400 Thousand/uL 227 228 221   CMP Latest Ref Rng & Units 04/29/2018 02/13/2018 04/13/2016  Glucose 65 - 99 mg/dL 95 92 957(M)  BUN 7 - 25 mg/dL 20 14 10   Creatinine 0.60 -  1.35 mg/dL 7.34 0.37 0.96  Sodium 135 - 146 mmol/L 138 138 137  Potassium 3.5 - 5.3 mmol/L 4.4 4.3 3.9  Chloride 98 - 110 mmol/L 103 105 107  CO2 20 - 32 mmol/L 27 27 24   Calcium 8.6 - 10.3 mg/dL 43.8 38.1 8.4(C)  Total Protein 6.1 - 8.1 g/dL 7.4 7.2 -  Total Bilirubin 0.2 - 1.2 mg/dL 0.5 0.5 -  Alkaline Phos 38 - 126 U/L - - -  AST 10 - 40 U/L 29 18 -  ALT 9 - 46 U/L 78(H) 46 -    Imaging: No results found.  Speciality Comments: No specialty comments available.    Procedures:  No procedures performed Allergies: Patient has no known allergies.     Assessment / Plan:     Visit Diagnoses: Rheumatoid arthritis with rheumatoid factor  of multiple sites without organ or systems involvement (HCC) - RF+ and CCP +: He has no active synovitis on exam. He has no tenderness to palpation of PIP and MCP joints.  He denies any joint pain or joint swelling at this time.  He continues to have stiffness and occasional discomfort in bilateral hands.  He has been taking methotrexate 8 tablets by mouth once weekly and folic acid 2 mg daily.  He reports he has noticed significant improvement since starting on methotrexate.  He came for labs in 04/29/2018 and his ALT was 78.  He will not be restarted on MTX at this time.  We discussed the risks of staying on MTX while drinking alcohol. Patient continues to drink alcohol on a daily basis.  We will recheck ALT and AST today in the office.  We also discussed the indications, contraindications, and potential side effects of Enbrel.  We will apply for Enbrel.  Cosent was obtained today.  All questions were addressed.  We discussed the importance of getting yearly flu vaccinations and pneumococcal vaccine.  He is also going to follow up with his PCP for yearly skin examinations.  He wears sunscreen on a daily basis.  He is aware of the risks and benefits of Enbrel.  We will contact him once Enbrel is approved to schedule a nurse visit.   Medication counseling:   TB  Test: Negative 02/13/18 Hepatitis panel: negative 02/13/18 HIV: 09/2013 negative  SPEP: WNL 09/2013 Immunoglobulins: 12/14  Chest x-ray: Pending  Does patient have diagnosis of heart failure?  No  Counseled patient that Enbrel is a TNF blocking agent.  Reviewed Enbrel dose of 50 mg once weekly.  Counseled patient on purpose, proper use, and adverse effects of Enbrel.  Reviewed the most common adverse effects including infections, headache, and injection site reactions. Discussed that there is the possibility of an increased risk of malignancy but it is not well understood if this increased risk is due to the medication or the disease state.  Advised patient to get yearly dermatology exams due to risk of skin cancer.  Reviewed the importance of regular labs while on Enbrel therapy.  Advised patient to get standing labs one month after starting Enbrel then every 2 months.  Provided patient with standing lab orders.  Counseled patient that Enbrel should be held prior to scheduled surgery.  Counseled patient to avoid live vaccines while on Enbrel.  Advised patient to get annual influenza vaccine and the pneumococcal vaccine as needed.  Provided patient with medication education material and answered all questions.  Patient voiced understanding.  Patient consented to Enbrel.  Will upload consent into the media tab.  Reviewed storage instructions for Enbrel.  Advised initial injection must be administered in office.  Patient voiced understanding.    High risk medication use - MTX, folic acid  -AST and ALT will be checked today.  He will return in 1 month and then every 3 months. Plan: AST, ALT, DG Chest 2 View  Alcohol abuse: He continues to drink alcohol on a daily basis.  We discussed the risk of MTX and alcohol use.  We will switch him to Enbel.   Other fatigue: His fatigue has improved.   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 DG Chest 2 View  .  AST  . ALT   No orders of the defined types were placed in this encounter.   Face-to-face time spent with  patient was 30 minutes. Greater than 50% of time was spent in counseling and coordination of care.  Follow-Up Instructions: Return in about 3 months (around 08/15/2018) for Rheumatoid arthritis.   Gearldine Bienenstock, PA-C   I examined and evaluated the patient with Sherron Ales PA.  Patient has responded well to methotrexate.  Although he developed increased LFTs on methotrexate.  He has been drinking alcohol on a regular basis and will not be able to come off the alcohol.  We had detailed discussion regarding different treatment options.  At this point he wanted to take the handout on Enbrel and review and will contact us.  The plan of care was discussed as noted above.  Pollyann Savoy, MD  Note - This record has been created using Animal nutritionist.  Chart creation errors have been sought, but may not always  have been located. Such creation errors do not reflect on  the standard of medical care.

## 2018-05-15 ENCOUNTER — Ambulatory Visit (INDEPENDENT_AMBULATORY_CARE_PROVIDER_SITE_OTHER): Payer: No Typology Code available for payment source | Admitting: Rheumatology

## 2018-05-15 ENCOUNTER — Encounter: Payer: Self-pay | Admitting: Physician Assistant

## 2018-05-15 ENCOUNTER — Other Ambulatory Visit: Payer: Self-pay

## 2018-05-15 VITALS — BP 122/80 | HR 88 | Resp 15 | Ht 68.0 in | Wt 241.0 lb

## 2018-05-15 DIAGNOSIS — F1010 Alcohol abuse, uncomplicated: Secondary | ICD-10-CM

## 2018-05-15 DIAGNOSIS — A6002 Herpesviral infection of other male genital organs: Secondary | ICD-10-CM | POA: Diagnosis not present

## 2018-05-15 DIAGNOSIS — Z79899 Other long term (current) drug therapy: Secondary | ICD-10-CM

## 2018-05-15 DIAGNOSIS — M0579 Rheumatoid arthritis with rheumatoid factor of multiple sites without organ or systems involvement: Secondary | ICD-10-CM

## 2018-05-15 DIAGNOSIS — R5383 Other fatigue: Secondary | ICD-10-CM

## 2018-05-15 MED ORDER — ETANERCEPT 50 MG/ML ~~LOC~~ SOCT: 50.0000 mg | SUBCUTANEOUS | 0 refills | Status: DC

## 2018-05-15 NOTE — Patient Instructions (Signed)
Standing Labs We placed an order today for your standing lab work.    Please come back and get your standing labs in 1 month and then every 3 months   We have open lab Monday through Friday from 8:30-11:30 AM and 1:30-4:00 PM  at the office of Dr. Pollyann Savoy.   You may experience shorter wait times on Monday and Friday afternoons. The office is located at [address], [place], [place] [address] No appointment is necessary.   Labs are drawn by [facility].  You may receive a bill from [facility] for your lab work. If you have any questions regarding directions or hours of operation,  please call [phone].    Etanercept injection What is this medicine? ETANERCEPT (et a [employer]) is used for the treatment of rheumatoid arthritis in adults and children. The medicine is also used to treat psoriatic arthritis, ankylosing spondylitis, and psoriasis. This medicine may be used for other purposes; ask your health care provider or pharmacist if you have questions. COMMON BRAND NAME(S): Enbrel What should I tell my health care provider before I take this medicine? They need to know if you have any of these conditions: -blood disorders -cancer -congestive heart failure -diabetes -exposure to chickenpox -immune system problems -infection -multiple sclerosis -seizure disorder -tuberculosis, a positive skin test for tuberculosis or have recently been in close contact with someone who has tuberculosis -Wegener's granulomatosis -an unusual or allergic reaction to etanercept, latex, other medicines, foods, dyes, or preservatives -pregnant or trying to get pregnant -breast-feeding How should I use this medicine? The medicine is given by injection under the skin. You will be taught how to prepare and give this medicine. Use exactly as directed. Take your medicine at regular intervals. Do not take your medicine more often than directed. It is important that you put your used needles  and syringes in a special sharps container. Do not put them in a trash can. If you do not have a sharps container, call your pharmacist or healthcare provider to get one. A special MedGuide will be given to you by the pharmacist with each prescription and refill. Be sure to read this information carefully each time. Talk to your pediatrician regarding the use of this medicine in children. While this drug may be prescribed for children as young as 26 years of age for selected conditions, precautions do apply. Overdosage: If you think you have taken too much of this medicine contact a poison control center or emergency room at once. NOTE: This medicine is only for you. Do not share this medicine with others. What if I miss a dose? If you miss a dose, contact your health care professional to find out when you should take your next dose. Do not take double or extra doses without advice. What may interact with this medicine? Do not take this medicine with any of the following medications: -anakinra This medicine may also interact with the following medications: -cyclophosphamide -sulfasalazine -vaccines This list may not describe all possible interactions. Give your health care provider a list of all the medicines, herbs, non-prescription drugs, or dietary supplements you use. Also tell them if you smoke, drink alcohol, or use illegal drugs. Some items may interact with your medicine. What should I watch for while using this medicine? Tell your doctor or healthcare professional if your symptoms do not start to get better or if they get worse. You will be tested for tuberculosis (TB) before you start this medicine. If your doctor  prescribes any medicine for TB, you should start taking the TB medicine before starting this medicine. Make sure to finish the full course of TB medicine. Call your doctor or health care professional for advice if you get a fever, chills or sore throat, or other symptoms of a  cold or flu. Do not treat yourself. This drug decreases your body's ability to fight infections. Try to avoid being around people who are sick. What side effects may I notice from receiving this medicine? Side effects that you should report to your doctor or health care professional as soon as possible: -allergic reactions like skin rash, itching or hives, swelling of the face, lips, or tongue -changes in vision -fever, chills or any other sign of infection -numbness or tingling in legs or other parts of the body -red, scaly patches or raised bumps on the skin -shortness of breath or difficulty breathing -swollen lymph nodes in the neck, underarm, or groin areas -unexplained weight loss -unusual bleeding or bruising -unusual swelling or fluid retention in the legs -unusually weak or tired Side effects that usually do not require medical attention (report to your doctor or health care professional if they continue or are bothersome): -dizziness -headache -nausea -redness, itching, or swelling at the injection site -vomiting This list may not describe all possible side effects. Call your doctor for medical advice about side effects. You may report side effects to FDA at 1-800-FDA-1088. Where should I keep my medicine? Keep out of the reach of children. Store between 2 and 8 degrees C (36 and 46 degrees F). Do not freeze or shake. Protect from light. Throw away any unused medicine after the expiration date. You will be instructed on how to store this medicine. NOTE: This sheet is a summary. It may not cover all possible information. If you have questions about this medicine, talk to your doctor, pharmacist, or health care provider.  2018 Elsevier/Gold Standard (2012-04-22 15:33:36)

## 2018-05-16 ENCOUNTER — Telehealth: Payer: Self-pay | Admitting: *Deleted

## 2018-05-16 DIAGNOSIS — Z79899 Other long term (current) drug therapy: Secondary | ICD-10-CM

## 2018-05-16 DIAGNOSIS — F1010 Alcohol abuse, uncomplicated: Secondary | ICD-10-CM

## 2018-05-16 LAB — AST: AST: 36 U/L (ref 10–40)

## 2018-05-16 LAB — ALT: ALT: 80 U/L — ABNORMAL HIGH (ref 9–46)

## 2018-05-16 NOTE — Telephone Encounter (Signed)
Order placed

## 2018-05-16 NOTE — Progress Notes (Signed)
ALT continues to trend up.  Last MTX doses was Sunday.  He will be starting Enbrel once it is approved through insurance.  Please advise patient to limit alcohol use.  We will recheck LFTs in 1 month.

## 2018-05-28 ENCOUNTER — Telehealth: Payer: Self-pay | Admitting: Pharmacy Technician

## 2018-05-28 NOTE — Telephone Encounter (Signed)
Left message for patient to call back. Enbrel has a high copay through patient's insurance plan [id number]). Called plan and it is a Museum/gallery [employer] card. Wanting to see if patient is interested in signing up for assistance through [employer]. Will need income information and patient to come sign forms. Will follow up.  8:25 AM Dorthula Nettles, CPhT

## 2018-06-03 NOTE — Telephone Encounter (Signed)
Spoke to patient, he is interested in applying for [employer] patient assistance. He will gather income information and will come by the office this week (8/5 to 8/9) to sign forms.   1:59 PM Dorthula Nettles, CPhT

## 2018-06-12 NOTE — Telephone Encounter (Signed)
Called patient to follow up on income information for Enbrel patient assistance. Patient said he is in the process of completing his taxes. He received an tax extension. He will provide income information once that is complete.  9:37 AM Dorthula Nettles, CPhT

## 2018-06-27 ENCOUNTER — Other Ambulatory Visit: Payer: Self-pay

## 2018-06-27 DIAGNOSIS — Z79899 Other long term (current) drug therapy: Secondary | ICD-10-CM

## 2018-06-27 LAB — HEPATIC FUNCTION PANEL
AG RATIO: 1.5 (calc) (ref 1.0–2.5)
ALKALINE PHOSPHATASE (APISO): 58 U/L (ref 40–115)
ALT: 29 U/L (ref 9–46)
AST: 15 U/L (ref 10–40)
Albumin: 4.4 g/dL (ref 3.6–5.1)
BILIRUBIN TOTAL: 0.6 mg/dL (ref 0.2–1.2)
Bilirubin, Direct: 0.1 mg/dL (ref 0.0–0.2)
Globulin: 3 g/dL (calc) (ref 1.9–3.7)
Indirect Bilirubin: 0.5 mg/dL (calc) (ref 0.2–1.2)
Total Protein: 7.4 g/dL (ref 6.1–8.1)

## 2018-06-28 NOTE — Progress Notes (Signed)
His LFTs are normal now off methotrexate.  He may start on Enbrel samples until the and will gets  some approved.

## 2018-07-04 ENCOUNTER — Telehealth: Payer: Self-pay | Admitting: Pharmacy Technician

## 2018-07-04 NOTE — Telephone Encounter (Signed)
Called patient to schedule nurse visit for Enbrel. No answer, and patient's voicemail is full. Will follow up.    1:48 PM Dorthula Nettles, CPhT

## 2018-07-08 NOTE — Telephone Encounter (Signed)
Spoke to patient and scheduled nurse visit for Enbrel for Wednesday, Sept 11th @ 9am. Patient prefers morning appointment. Patient will start with a sample. Patient will sign patient assistance documents at appointment.  9:34 AM Dorthula Nettles, CPhT

## 2018-07-10 ENCOUNTER — Ambulatory Visit (INDEPENDENT_AMBULATORY_CARE_PROVIDER_SITE_OTHER): Payer: No Typology Code available for payment source | Admitting: *Deleted

## 2018-07-10 VITALS — BP 138/82 | HR 76

## 2018-07-10 DIAGNOSIS — M0579 Rheumatoid arthritis with rheumatoid factor of multiple sites without organ or systems involvement: Secondary | ICD-10-CM | POA: Diagnosis not present

## 2018-07-10 MED ORDER — ETANERCEPT 50 MG/ML ~~LOC~~ SOCT
50.0000 mg | Freq: Once | SUBCUTANEOUS | Status: AC
  Administered 2018-07-10: 50 mg via SUBCUTANEOUS

## 2018-07-10 NOTE — Progress Notes (Signed)
Patient in office for a new start to Enbrel Mini. Patient was given a demonstration for the proper technique to administer Enbrel Mini. Patient was able to demonstrate the technique. Patient was provided with a sample.  Patient gave injection in right leg and tolerated injection well. Patient was monitored in the office for 30 minutes after administration for adverse reactions. No adverse reactions noted.   Administrations This Visit    Etanercept SOCT 50 mg    Admin Date 07/10/2018 Action Given Dose 50 mg Route Subcutaneous Administered By Henriette Combs, LPN

## 2018-07-10 NOTE — Patient Instructions (Signed)
Standing Labs We placed an order today for your standing lab work.    Please come back and get your standing labs in 1 month and then every 3 months  We have open lab Monday through Friday from 8:30-11:30 AM and 1:30-4:00 PM  at the office of Dr. Shaili Deveshwar.   You may experience shorter wait times on Monday and Friday afternoons. The office is located at [address], [address], [place], [place] [address] No appointment is necessary.   Labs are drawn by [facility].  You may receive a bill from [facility] for your lab work. If you have any questions regarding directions or hours of operation,  please call [phone].    

## 2018-07-10 NOTE — Telephone Encounter (Signed)
Faxed application to [employer]. Awaiting response.  Fax#[phone]  9:30 AM Dorthula Nettles, CPhT

## 2018-07-12 NOTE — Telephone Encounter (Signed)
error 

## 2018-07-12 NOTE — Telephone Encounter (Signed)
Received a fax from [employer] requesting patient's pharmacy benefit information. [employer], spoke to [place], fax was blurry. Provided necessary information. Case will go into review. Will follow up.  10:14 AM Dorthula Nettles, CPhT

## 2018-07-16 NOTE — Telephone Encounter (Signed)
Received approval for Patient Assistance from [employer] for Enbrel. Patient has been approved for assistance through 10/29/18.  Will send document to scan center.  Phone# [phone]  10:07 AM Dorthula Nettles, CPhT

## 2018-07-22 ENCOUNTER — Telehealth: Payer: Self-pay | Admitting: Rheumatology

## 2018-07-22 NOTE — Telephone Encounter (Signed)
Spoke to patient, he has not spoken to [employer] to schedule Enbrel shipment. Advised they may have called from a 800 number. Patient said he may have ignored. Gave him the number to call them directly to schedule shipment. He will call office back if he has any questions.  Phone# [phone]  11:10 AM Dorthula Nettles, CPhT

## 2018-07-22 NOTE — Telephone Encounter (Signed)
Patient called requesting a return call regarding his Enbrel medication.

## 2018-08-01 NOTE — Progress Notes (Signed)
Office Visit Note  Patient: John Blackwell             Date of Birth: 1971-02-25           MRN: [id number]             PCP: Deon Pilling Referring: Dorena Bodo, PA-C Visit Date: 08/15/2018 Occupation: @GUAROCC @  Subjective:  Bilateral hand pain   History of Present Illness: John Blackwell is a 47 y.o. male with history of seropositive rheumatoid arthritis. He had his first Enbrel injection on 07/10/18.  He reports he has not received a shipment of Enbrel, so he has not had another injection.  He has been having difficulty getting in contact with [employer] due to them calling his work number.  He reports he is having pain and swelling in bilateral 5th PIP joints.  He states a couple weeks ago he had a RA flare in the right shoulder.  He has a very physically demanding job and climbs ladders on a daily basis.    Activities of Daily Living:  Patient reports morning stiffness for 15  minutes.   Patient Denies nocturnal pain.  Difficulty dressing/grooming: Denies Difficulty climbing stairs: Denies Difficulty getting out of chair: Denies Difficulty using hands for taps, buttons, cutlery, and/or writing: Denies  Review of Systems  Constitutional: Positive for fatigue. Negative for night sweats.  HENT: Negative for mouth sores, trouble swallowing, trouble swallowing, mouth dryness and nose dryness.   Eyes: Negative for redness and dryness.  Respiratory: Negative for cough, hemoptysis, shortness of breath and difficulty breathing.   Cardiovascular: Negative for chest pain, palpitations, hypertension, irregular heartbeat and swelling in legs/feet.  Gastrointestinal: Negative for blood in stool, constipation and diarrhea.  Endocrine: Negative for increased urination.  Genitourinary: Negative for painful urination.  Musculoskeletal: Positive for arthralgias, joint pain, joint swelling, myalgias, morning stiffness and myalgias. Negative for muscle weakness and muscle tenderness.    Skin: Negative for color change, rash, hair loss, nodules/bumps, skin tightness, ulcers and sensitivity to sunlight.  Allergic/Immunologic: Negative for susceptible to infections.  Neurological: Negative for dizziness, fainting, memory loss, night sweats and weakness.  Hematological: Negative for swollen glands.  Psychiatric/Behavioral: Negative for depressed mood and sleep disturbance. The patient is not nervous/anxious.     PMFS History:  Patient Active Problem List   Diagnosis Date Noted  . Ruptured suppurative appendicitis 04/13/2016  . Right flank mass 10/14/2014  . RA (rheumatoid arthritis) (HCC) 08/27/2014  . Rheumatoid factor positive 12/19/2013  . Arthralgia of multiple sites 11/18/2013  . Arthritis 08/06/2013  . 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 Alcohol abuse     Past Medical History:  Diagnosis Date  . Alcohol abuse   . Arthritis 08/06/2013   OA of hands,feet, knees  . 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 RA (rheumatoid arthritis) (HCC)     Family History  Problem Relation Age of Onset  . Fibromyalgia Mother   . Thyroid disease Brother   . Healthy Daughter    Past Surgical History:  Procedure Laterality Date  . LAPAROSCOPIC APPENDECTOMY N/A 04/11/2016   Procedure: APPENDECTOMY LAPAROSCOPIC;  Surgeon: Ovidio Kin, MD;  Location: [facility];  Service: General;  Laterality: N/A;   Social History   Social History Narrative   Does Gutter Work. Climbs Ladders.   Alcohol: 3-5 Beers each week day.                Friday Nights: 12 pack  Saturday: 12 pack                Sundays: None   Says that in past drank more. And also drank liquor in past but rarely drinks any liquor now.      Never smoked.    Single.    Has Girlfriend.   Has lost 25 lbs in past year secondary to diet changes-stopped eating late night.(entered 07/2013)   Was lifting weights until past 2 months-stopped sec to pain.    Objective: Vital Signs: BP 128/79 (BP Location: Left Arm,  Patient Position: Sitting, Cuff Size: Normal)   Pulse 65   Resp 14   Ht 5\' 8"  (1.727 m)   Wt 239 lb 12.8 oz (108.8 kg)   BMI 36.46 kg/m    Physical Exam  Constitutional: He is oriented to person, place, and time. He appears well-developed and well-nourished.  HENT:  Head: Normocephalic and atraumatic.  Eyes: Pupils are equal, round, and reactive to light. Conjunctivae and EOM are normal.  Neck: Normal range of motion. Neck supple.  Cardiovascular: Normal rate, regular rhythm and normal heart sounds.  Pulmonary/Chest: Effort normal and breath sounds normal.  Abdominal: Soft. Bowel sounds are normal.  Lymphadenopathy:    He has no cervical adenopathy.  Neurological: He is alert and oriented to person, place, and time.  Skin: Skin is warm and dry. Capillary refill takes less than 2 seconds.  Psychiatric: He has a normal mood and affect. His behavior is normal.  Nursing note and vitals reviewed.    Musculoskeletal Exam: C-spine, thoracic spine, and lumbar spine good ROM.  No midline spinal tenderness.  No SI joint tenderness.  Shoulder joints, elbow joints, wrist joints, MCPs, PIPs,and DIPs good ROM.  He has synovitis of bilateral 5th PIP joints.  He has complete fist formation bilaterally.  Hip joints, knee joints, ankle joints, MTPs, PIPs, and DIPs good ROM with no synovitis.  No warmth or effusion of knee joints.  No tenderness or swelling of ankle joints.  No tenderness of trochanteric bursa bilaterally.   CDAI Exam: CDAI Score: 4.4  Patient Global Assessment: 2 (mm); Provider Global Assessment: 2 (mm) Swollen: 2 ; Tender: 2  Joint Exam      Right  Left  PIP 5  Swollen Tender  Swollen Tender     Investigation: No additional findings.  Imaging: No results found.  Recent Labs: Lab Results  Component Value Date   WBC 5.7 04/29/2018   HGB 15.9 04/29/2018   PLT 227 04/29/2018   NA 138 04/29/2018   K 4.4 04/29/2018   CL 103 04/29/2018   CO2 27 04/29/2018   GLUCOSE 95  04/29/2018   BUN 20 04/29/2018   CREATININE 1.17 04/29/2018   BILITOT 0.6 06/27/2018   ALKPHOS 66 04/10/2016   AST 15 06/27/2018   ALT 29 06/27/2018   PROT 7.4 06/27/2018   ALBUMIN 4.5 04/10/2016   CALCIUM 10.0 04/29/2018   GFRAA 86 04/29/2018   QFTBGOLDPLUS NEGATIVE 02/13/2018    Speciality Comments: No specialty comments available.  Procedures:  No procedures performed Allergies: Patient has no known allergies.   Assessment / Plan:     Visit Diagnoses: Rheumatoid arthritis with rheumatoid factor of multiple sites without organ or systems involvement (HCC) - RF+ and CCP +: He has synovitis of bilateral 5th PIP joints.  He had a flare 2 weeks ago in the right shoulder joint.  He continues to have arthralgias due to having a physically demanding job. He  declined a prednisone taper. He was started on Enbrel 50 mg sq injection on 07/10/18 in the office.  He has not been returning [employer]'s calls regarding patient assistance and the shipment of Enbrel.  He has only had 1 dose.  We will provide 2 samples to the patient today. He stayed in the office to finalize patient assistance and set up the shipment of Enbrel.  He will return for CBC and CMP in 1 month then every 3 months. He declined wanting to get the influenza vaccine this year.  We discussed the immunosuppressive risks of being on Enbrel.  He will follow up in the office in 3 months.  He was advise to notify us if he develops increased joint pain or joint swelling.    High risk medication use - Enbrel. He will return in 1 month and every 3 months for CBC and CMP. - Plan: CBC with Differential/Platelet, COMPLETE METABOLIC PANEL WITH GFR  Other fatigue: Chronic   Orders: Orders Placed This Encounter  Procedures  . CBC with Differential/Platelet  . COMPLETE METABOLIC PANEL WITH GFR   No orders of the defined types were placed in this encounter.    Follow-Up Instructions: Return in about 3 months (around 11/15/2018) for Rheumatoid  arthritis.   Gearldine Bienenstock, PA-C  Note - This record has been created using Dragon software.  Chart creation errors have been sought, but may not always  have been located. Such creation errors do not reflect on  the standard of medical care.

## 2018-08-15 ENCOUNTER — Encounter: Payer: Self-pay | Admitting: Physician Assistant

## 2018-08-15 ENCOUNTER — Ambulatory Visit (INDEPENDENT_AMBULATORY_CARE_PROVIDER_SITE_OTHER): Payer: No Typology Code available for payment source | Admitting: Physician Assistant

## 2018-08-15 VITALS — BP 128/79 | HR 65 | Resp 14 | Ht 68.0 in | Wt 239.8 lb

## 2018-08-15 DIAGNOSIS — M0579 Rheumatoid arthritis with rheumatoid factor of multiple sites without organ or systems involvement: Secondary | ICD-10-CM

## 2018-08-15 DIAGNOSIS — R5383 Other fatigue: Secondary | ICD-10-CM

## 2018-08-15 DIAGNOSIS — Z79899 Other long term (current) drug therapy: Secondary | ICD-10-CM | POA: Diagnosis not present

## 2018-08-15 NOTE — Progress Notes (Signed)
Pharmacy Note  Subjective: Patient presents today to the [facility] to see Dr. Corliss Skains.   Patient seen by the pharmacist for counseling on Enbrel.    Objective: TB Test: Negative 02/13/18 Hepatitis panel: negative 02/13/18 HIV: 09/2013 negative  SPEP: WNL 09/2013 Immunoglobulins: 12/14  Chest x-ray: Pending  CBC    Component Value Date/Time   WBC 5.7 04/29/2018 1045   RBC 5.51 04/29/2018 1045   HGB 15.9 04/29/2018 1045   HCT 46.2 04/29/2018 1045   PLT 227 04/29/2018 1045   MCV 83.8 04/29/2018 1045   MCH 28.9 04/29/2018 1045   MCHC 34.4 04/29/2018 1045   RDW 13.7 04/29/2018 1045   LYMPHSABS 1,932 04/29/2018 1045   MONOABS 0.8 04/10/2016 2102   EOSABS 359 04/29/2018 1045   BASOSABS 57 04/29/2018 1045    Does patient have diagnosis of heart failure?  No  Assessment/Plan:  Counseled patient that Enbrel is a TNF blocking agent.  Reviewed Enbrel dose of 50 mg once weekly.  Counseled patient on purpose, proper use, and adverse effects of Enbrel.  Reviewed the most common adverse effects including infections, headache, and injection site reactions. Discussed that there is the possibility of an increased risk of malignancy but it is not well understood if this increased risk is due to the medication or the disease state.  Advised patient to get yearly dermatology exams due to risk of skin cancer.  Reviewed the importance of regular labs while on Enbrel therapy.  Advised patient to get standing labs one month after starting Enbrel then every 3 months.  Provided patient with standing lab orders.  Counseled patient that Enbrel should be held prior to scheduled surgery.  Counseled patient to avoid live vaccines while on Enbrel.  Advised patient to get annual influenza vaccine and the pneumococcal vaccine as needed.  Provided patient with medication education material and answered all questions.  Patient voiced understanding.    Patient had difficulty obtaining medication  through patient assistance program.  Fleet Contras specialty pharmacy patient advocate called patient assistance program and helped him be set up with her specialty pharmacy.  Medication will be shipped to his home tomorrow.  Encourage patient to call with any issues obtaining his medication as we have staff specifically dedicated to help get the medication in his hand.  Patient voiced understanding.  His last Enbrel injection was during his new start visit on 07/10/2018.  He has not had any other injections. Demonstrated proper injection technique with Enbrel demo pen.  Patient able to demonstrate proper injection technique using the teach back method.  Sample medication given for injection at home for use with autotouch device given at new start visit.  Sample Medication: Wenda Overland Cartridge NDC: [id number] Lot: [id number] Expiration: 07/2020  All questions encouraged and answered.  Instructed patient to call with any other issues.  Verlin Fester, PharmD, [facility] Rheumatology Clinical Pharmacist  08/15/2018 9:44 AM

## 2018-08-15 NOTE — Telephone Encounter (Signed)
Patient came in today and stated that he hasn't scheduled Enbrel shipment with [employer]. Called [employer] with patient, he scheduled shipment for 08/16/18. Advised patient to call if he has any issues or questions.  9:17 AM Dorthula Nettles, CPhT

## 2018-08-15 NOTE — Patient Instructions (Addendum)
Standing Labs We placed an order today for your standing lab work.    Please come back and get your standing labs in 1 month and then every 3 months   We have open lab Monday through Friday from 8:30-11:30 AM and 1:30-4:00 PM  at the office of Dr. Pollyann Savoy.   You may experience shorter wait times on Monday and Friday afternoons. The office is located at [address], [place], [place] [address] No appointment is necessary.   Labs are drawn by [facility].  You may receive a bill from [facility] for your lab work. If you have any questions regarding directions or hours of operation,  please call [phone].   Just as a reminder please drink plenty of water prior to coming for your lab work. Thanks!  Vaccines You are taking a medication(s) that can suppress your immune system.  The following immunizations are recommended: . Flu annually . Pneumonia (Pneumovax 23) . Shingrix . Hepatitis B  Please check with your PCP to make sure you are up to date.  In addition to staying up to date on lab work and vaccines it is important to:  Marland Kitchen Yearly skin checks with dermatologist due to increase risk in skin cancer with TNF inhibitors .

## 2018-08-19 NOTE — Telephone Encounter (Signed)
Spoke to patient, he received shipment. Will call if he has any questions.  9:53 AM Dorthula Nettles, CPhT

## 2018-09-02 ENCOUNTER — Telehealth: Payer: Self-pay | Admitting: Physician Assistant

## 2018-09-02 MED ORDER — VALACYCLOVIR HCL 500 MG PO TABS: 500.0000 mg | ORAL_TABLET | Freq: Every day | ORAL | 3 refills | Status: AC

## 2018-09-02 NOTE — Telephone Encounter (Signed)
Patient needs refill on valtrex  To be sent to [facility]

## 2018-09-02 NOTE — Telephone Encounter (Signed)
rx sent to pharmacy as requested

## 2018-09-19 ENCOUNTER — Telehealth: Payer: Self-pay | Admitting: Pharmacy Technician

## 2018-09-19 NOTE — Telephone Encounter (Signed)
Left message for patient, reminding him that he has lab work that is due. Advised him of our lab times.  2:44 PM Dorthula Nettles, CPhT

## 2018-10-14 ENCOUNTER — Telehealth: Payer: Self-pay | Admitting: Rheumatology

## 2018-10-14 NOTE — Telephone Encounter (Signed)
Last Visit: 08/15/18 Next Visit: 11/13/18 Labs: 04/29/18 Alt is 78 All other lab values are within normal limits. Tb Gold: 02/13/18 Neg   Patient advised he is due for labs. Patient will come to office for labs and to pick up samples.

## 2018-10-14 NOTE — Telephone Encounter (Signed)
Patient called requesting prescription refill of Enbrel.  Patient states he was due to take his injection last weekend.  Patient requested a return call.

## 2018-10-15 ENCOUNTER — Other Ambulatory Visit: Payer: Self-pay

## 2018-10-15 DIAGNOSIS — Z79899 Other long term (current) drug therapy: Secondary | ICD-10-CM

## 2018-10-15 NOTE — Telephone Encounter (Signed)
Patient came in for labs today and picked up 2 samples of Enbrel. Had patient call [employer] and scheduled next shipment of Enbrel for 10/16/18. Sue Lush will fax in patient's renewal application with new prescription after we receive lab results.  Provided patient with [employer] number to program in his phone for refill calls.  11:02 AM John Blackwell CPhT

## 2018-10-16 LAB — COMPLETE METABOLIC PANEL WITH GFR
AG RATIO: 2 (calc) (ref 1.0–2.5)
ALT: 59 U/L — AB (ref 9–46)
AST: 25 U/L (ref 10–40)
Albumin: 4.6 g/dL (ref 3.6–5.1)
Alkaline phosphatase (APISO): 51 U/L (ref 40–115)
BILIRUBIN TOTAL: 0.6 mg/dL (ref 0.2–1.2)
BUN: 18 mg/dL (ref 7–25)
CALCIUM: 9.7 mg/dL (ref 8.6–10.3)
CHLORIDE: 105 mmol/L (ref 98–110)
CO2: 24 mmol/L (ref 20–32)
Creat: 1.03 mg/dL (ref 0.60–1.35)
GFR, EST AFRICAN AMERICAN: 99 mL/min/{1.73_m2} (ref >=60)
GFR, EST NON AFRICAN AMERICAN: 85 mL/min/{1.73_m2} (ref >=60)
Globulin: 2.3 g/dL (calc) (ref 1.9–3.7)
Glucose, Bld: 88 mg/dL (ref 65–99)
POTASSIUM: 4.5 mmol/L (ref 3.5–5.3)
Sodium: 138 mmol/L (ref 135–146)
TOTAL PROTEIN: 6.9 g/dL (ref 6.1–8.1)

## 2018-10-16 LAB — CBC WITH DIFFERENTIAL/PLATELET
Absolute Monocytes: 644 cells/uL (ref 200–950)
BASOS PCT: 1.2 %
Basophils Absolute: 70 cells/uL (ref 0–200)
EOS PCT: 5.8 %
Eosinophils Absolute: 336 cells/uL (ref 15–500)
HCT: 46.9 % (ref 38.5–50.0)
Hemoglobin: 15.9 g/dL (ref 13.2–17.1)
Lymphs Abs: 2088 cells/uL (ref 850–3900)
MCH: 28.8 pg (ref 27.0–33.0)
MCHC: 33.9 g/dL (ref 32.0–36.0)
MCV: 85 fL (ref 80.0–100.0)
MONOS PCT: 11.1 %
MPV: 11.5 fL (ref 7.5–12.5)
NEUTROS ABS: 2662 {cells}/uL (ref 1500–7800)
Neutrophils Relative %: 45.9 %
Platelets: 214 10*3/uL (ref 140–400)
RBC: 5.52 10*6/uL (ref 4.20–5.80)
RDW: 13.1 % (ref 11.0–15.0)
Total Lymphocyte: 36 %
WBC: 5.8 10*3/uL (ref 3.8–10.8)

## 2018-10-16 NOTE — Progress Notes (Signed)
CBC WNL. ALT is elevated. AST WNL. Please advise patient to avoid NSAIDs, tylenol and alcohol.

## 2018-10-31 NOTE — Progress Notes (Signed)
Office Visit Note  Patient: John Blackwell             Date of Birth: 08-Jun-1971           MRN: [id number]             PCP: Deon Pilling Referring: Dorena Bodo, PA-C Visit Date: 11/13/2018 Occupation: @GUAROCC @  Subjective:  Medication monitoring   History of Present Illness: John Blackwell is a 48 y.o. male with history of seropositive rheumatoid arthritis.  He is on Enbrel 50 mg sq injections once weekly.  He denies any recent rheumatoid arthritis flares.  He denies any joint pain or joint swelling at this time.  He states that he has some stiffness in his hands lasting about 10 to 15 minutes first thing in the morning.  He states that he has been able to do all of his duties at work.  He denies any recent infections.  He denies any side effects or injection site reactions from Enbrel.  He did not receive annual influenza vaccination.  He denies any concerns at this time.  Activities of Daily Living:  Patient reports morning stiffness for 10-15 minutes.   Patient Denies nocturnal pain.  Difficulty dressing/grooming: Denies Difficulty climbing stairs: Denies Difficulty getting out of chair: Denies Difficulty using hands for taps, buttons, cutlery, and/or writing: Denies  Review of Systems  Constitutional: Negative for fatigue and night sweats.  HENT: Negative for mouth sores, trouble swallowing, trouble swallowing, mouth dryness and nose dryness.   Eyes: Negative for pain, redness, itching and dryness.  Respiratory: Negative for cough, hemoptysis, shortness of breath, wheezing and difficulty breathing.   Cardiovascular: Negative for chest pain, palpitations, hypertension, irregular heartbeat and swelling in legs/feet.  Gastrointestinal: Negative for abdominal pain, blood in stool, constipation and diarrhea.  Endocrine: Negative for increased urination.  Genitourinary: Negative for painful urination, pelvic pain and urgency.  Musculoskeletal: Positive for  arthralgias, joint pain and morning stiffness. Negative for joint swelling, myalgias, muscle weakness, muscle tenderness and myalgias.  Skin: Negative for color change, rash, hair loss, nodules/bumps, skin tightness, ulcers and sensitivity to sunlight.  Allergic/Immunologic: Negative for susceptible to infections.  Neurological: Negative for dizziness, fainting, light-headedness, headaches, memory loss, night sweats and weakness.  Hematological: Negative for anemia, bruising/bleeding tendency and swollen glands.  Psychiatric/Behavioral: Negative for depressed mood, confusion and sleep disturbance. The patient is not nervous/anxious.     PMFS History:  Patient Active Problem List   Diagnosis Date Noted  . Ruptured suppurative appendicitis 04/13/2016  . Right flank mass 10/14/2014  . RA (rheumatoid arthritis) (HCC) 08/27/2014  . Rheumatoid factor positive 12/19/2013  . Arthralgia of multiple sites 11/18/2013  . Arthritis 08/06/2013  . 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 Alcohol abuse     Past Medical History:  Diagnosis Date  . Alcohol abuse   . Arthritis 08/06/2013   OA of hands,feet, knees  . 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 RA (rheumatoid arthritis) (HCC)     Family History  Problem Relation Age of Onset  . Fibromyalgia Mother   . Thyroid disease Brother   . Healthy Daughter    Past Surgical History:  Procedure Laterality Date  . LAPAROSCOPIC APPENDECTOMY N/A 04/11/2016   Procedure: APPENDECTOMY LAPAROSCOPIC;  Surgeon: Ovidio Kin, MD;  Location: [facility];  Service: General;  Laterality: N/A;   Social History   Social History Narrative   Does Gutter Work. Climbs Ladders.   Alcohol: 3-5 Beers  each week day.                Friday Nights: 12 pack                Saturday: 12 pack                Sundays: None   Says that in past drank more. And also drank liquor in past but rarely drinks any liquor now.      Never smoked.    Single.    Has Girlfriend.   Has lost 25  lbs in past year secondary to diet changes-stopped eating late night.(entered 07/2013)   Was lifting weights until past 2 months-stopped sec to pain.    Objective: Vital Signs: BP 111/70 (BP Location: Left Arm, Patient Position: Sitting, Cuff Size: Normal)   Pulse 69   Resp 15   Ht 5\' 8"  (1.727 m)   Wt 243 lb (110.2 kg)   BMI 36.95 kg/m    Physical Exam Vitals signs and nursing note reviewed.  Constitutional:      Appearance: He is well-developed.  HENT:     Head: Normocephalic and atraumatic.  Eyes:     Conjunctiva/sclera: Conjunctivae normal.     Pupils: Pupils are equal, round, and reactive to light.  Neck:     Musculoskeletal: Normal range of motion and neck supple.  Cardiovascular:     Rate and Rhythm: Normal rate and regular rhythm.     Heart sounds: Normal heart sounds.  Pulmonary:     Effort: Pulmonary effort is normal.     Breath sounds: Normal breath sounds.  Abdominal:     General: Bowel sounds are normal.     Palpations: Abdomen is soft.  Lymphadenopathy:     Cervical: No cervical adenopathy.  Skin:    General: Skin is warm and dry.     Capillary Refill: Capillary refill takes less than 2 seconds.  Neurological:     Mental Status: He is alert and oriented to person, place, and time.  Psychiatric:        Behavior: Behavior normal.      Musculoskeletal Exam: C-spine, thoracic spine, lumbar spine good range of motion.  No midline spinal tenderness.  No SI joint tenderness.  Shoulder joints, elbow joints, wrist joints, MCPs, PIPs, DIPs good range of motion with no synovitis.  He has PIP and DIP synovial thickening consistent with osteoarthritis of bilateral hands.  Hip joints, knee joints, ankle joints, MTPs, PIPs and DIPs good range of motion no synovitis.  No warmth or effusion bilateral knee joints.  No tenderness over trochanteric bursa bilaterally.  CDAI Exam: CDAI Score: 0.4  Patient Global Assessment: 2 (mm); Provider Global Assessment: 2 (mm) Swollen:  0 ; Tender: 0  Joint Exam   Not documented   There is currently no information documented on the homunculus. Go to the Rheumatology activity and complete the homunculus joint exam.  Investigation: No additional findings.  Imaging: No results found.  Recent Labs: Lab Results  Component Value Date   WBC 5.8 10/15/2018   HGB 15.9 10/15/2018   PLT 214 10/15/2018   NA 138 10/15/2018   K 4.5 10/15/2018   CL 105 10/15/2018   CO2 24 10/15/2018   GLUCOSE 88 10/15/2018   BUN 18 10/15/2018   CREATININE 1.03 10/15/2018   BILITOT 0.6 10/15/2018   ALKPHOS 66 04/10/2016   AST 25 10/15/2018   ALT 59 (H) 10/15/2018   PROT 6.9 10/15/2018  ALBUMIN 4.5 04/10/2016   CALCIUM 9.7 10/15/2018   GFRAA 99 10/15/2018   QFTBGOLDPLUS NEGATIVE 02/13/2018    Speciality Comments: No specialty comments available.  Procedures:  No procedures performed Allergies: Patient has no known allergies.   Assessment / Plan:     Visit Diagnoses: Rheumatoid arthritis with rheumatoid factor of multiple sites without organ or systems involvement (HCC) - RF+ and CCP +: He has no synovitis on exam.  He has not had any recent rheumatoid arthritis flares.  He is clinically doing well on Enbrel 50 mg subcutaneous injections once weekly.  He has no joint pain or joint swelling at this time.  He has morning stiffness in both hands lasting about 10 to 15 minutes.  He has been able to perform all of his duties at work since starting on Enbrel.  He feels as though Enbrel is been very effective.  He was given 2 samples of Enbrel today in the office.  He has not had any side effects or injection site reactions.  He has not had any recent infections.  He will continue to have lab work every 3 months.  He was advised to notify us if develops increased joint pain or joint swelling.  He will follow-up in the office in 5 months.  High risk medication use - Enbrel 50 mg weekly. D/c MTX due to elevated LFTs-he continues to drink 2-3  beers per night.  Last TB gold negative on 02/13/2018.  Most recent CBC/CMP within normal limits except elevated liver enzymes on 10/15/2018 patient advised to avoid NSAIDs Tylenol and alcohol.  He will return for CBC/CMP every 3 months and standing orders are in place. He has not had any recent infections.  He declined having the annual influenza vaccination. He had X-ray of bilateral hands and feet taken on 02/13/2018.  Other fatigue: Improved.   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  No orders of the defined types were placed in this encounter.    Follow-Up Instructions: Return in about 5 months (around 04/14/2019) for Rheumatoid arthritis.   Gearldine Bienenstockaylor M Kielyn Kardell, PA-C  Note - This record has been created using Dragon software.  Chart creation errors have been sought, but may not always  have been located. Such creation errors do not reflect on  the standard of medical care.

## 2018-11-05 NOTE — Telephone Encounter (Signed)
Called [employer] to follow up on application, the rep said it was not received. Refaxed application.

## 2018-11-07 NOTE — Telephone Encounter (Signed)
Spoke to patient, he has medication on hand, will sign paperwork during his next appointment on 11/13/18.

## 2018-11-07 NOTE — Telephone Encounter (Signed)
Received fax from [employer] stating the application received is no longer valid, the application has been updated effective this week. Asked if since the date application was signed by patient and provider was prior to this week, could it be accepted. Case manager stated no. New application will be completed and faxed in.   Will call patient to see if he needs a sample, he has an appointment on 11/13/2018.

## 2018-11-13 ENCOUNTER — Ambulatory Visit: Payer: No Typology Code available for payment source | Admitting: Physician Assistant

## 2018-11-13 ENCOUNTER — Encounter: Payer: Self-pay | Admitting: Physician Assistant

## 2018-11-13 VITALS — BP 111/70 | HR 69 | Resp 15 | Ht 68.0 in | Wt 243.0 lb

## 2018-11-13 DIAGNOSIS — Z79899 Other long term (current) drug therapy: Secondary | ICD-10-CM

## 2018-11-13 DIAGNOSIS — F1010 Alcohol abuse, uncomplicated: Secondary | ICD-10-CM

## 2018-11-13 DIAGNOSIS — M0579 Rheumatoid arthritis with rheumatoid factor of multiple sites without organ or systems involvement: Secondary | ICD-10-CM

## 2018-11-13 DIAGNOSIS — A6002 Herpesviral infection of other male genital organs: Secondary | ICD-10-CM

## 2018-11-13 DIAGNOSIS — R5383 Other fatigue: Secondary | ICD-10-CM

## 2018-11-13 NOTE — Progress Notes (Signed)
Medication Samples have been provided to the patient.  Drug name: Enbrel mini       Strength: 50mg        Qty: 2 cartridges  LOT: [id number]  Exp.Date: 12/2019  Dosing instructions: Inject 50mg  into the skin once weekly.   The patient has been instructed regarding the correct time, dose, and frequency of taking this medication, including desired effects and most common side effects.   John Blackwell 8:55 AM 11/13/2018

## 2018-11-13 NOTE — Patient Instructions (Signed)
Standing Labs We placed an order today for your standing lab work.    Please come back and get your standing labs in March and every 3 months  We have open lab Monday through Friday from 8:30-11:30 AM and 1:30-4:00 PM  at the office of Dr. Shaili Deveshwar.   You may experience shorter wait times on Monday and Friday afternoons. The office is located at [address], [address], [place], [place] [address] No appointment is necessary.   Labs are drawn by [facility].  You may receive a bill from [facility] for your lab work.  If you wish to have your labs drawn at another location, please call the office 24 hours in advance to send orders.  If you have any questions regarding directions or hours of operation,  please call [phone].   Just as a reminder please drink plenty of water prior to coming for your lab work. Thanks!  

## 2018-11-20 NOTE — Telephone Encounter (Signed)
Received fax from [employer], application has been APPROVED. Coverage is from 11/20/2018 to 11/20/2019.  Will send document to [facility].  Phone# [phone] Fax# [phone]

## 2019-04-02 NOTE — Progress Notes (Deleted)
Office Visit Note  Patient: John Blackwell             Date of Birth: 10/29/1971           MRN: [id number]             PCP: Deon Pilling Referring: Deon Pilling Visit Date: 04/16/2019 Occupation: @GUAROCC @  Subjective:  No chief complaint on file.  History of noncompliance with medication and labs due to cost.  Last fill of Enbrel 11/13/2018 for 90 day supply.  Previously on methotrexate but decreased due to elevated LFTs from alcohol use.  Refer to [facility] for PCP and labs?  Enbrel mini 50 mg every 7 days.  Last TB gold negative on 02/13/2018.  Due for TB gold today and will monitor yearly.  Most recent CBC/CMP within normal limits except for elevated ALT on 10/15/2018.  Due for CBC/CMP today and will monitor every 3 months.  Standing orders are in place.  Recommend annual flu, Prevnar 13, and Pneumovax 23 vaccines as indicated.  Recommend yearly skin exam.   History of Present Illness: John Blackwell is a 48 y.o. male ***   Activities of Daily Living:  Patient reports morning stiffness for *** {minute/hour:19697}.   Patient {ACTIONS;DENIES/REPORTS:21021675::"Denies"} nocturnal pain.  Difficulty dressing/grooming: {ACTIONS;DENIES/REPORTS:21021675::"Denies"} Difficulty climbing stairs: {ACTIONS;DENIES/REPORTS:21021675::"Denies"} Difficulty getting out of chair: {ACTIONS;DENIES/REPORTS:21021675::"Denies"} Difficulty using hands for taps, buttons, cutlery, and/or writing: {ACTIONS;DENIES/REPORTS:21021675::"Denies"}  No Rheumatology ROS completed.   PMFS History:  Patient Active Problem List   Diagnosis Date Noted  . Ruptured suppurative appendicitis 04/13/2016  . Right flank mass 10/14/2014  . RA (rheumatoid arthritis) (HCC) 08/27/2014  . Rheumatoid factor positive 12/19/2013  . Arthralgia of multiple sites 11/18/2013  . Arthritis 08/06/2013  . 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 Alcohol abuse     Past Medical History:  Diagnosis  Date  . Alcohol abuse   . Arthritis 08/06/2013   OA of hands,feet, knees  . 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 RA (rheumatoid arthritis) (HCC)     Family History  Problem Relation Age of Onset  . Fibromyalgia Mother   . Thyroid disease Brother   . Healthy Daughter    Past Surgical History:  Procedure Laterality Date  . LAPAROSCOPIC APPENDECTOMY N/A 04/11/2016   Procedure: APPENDECTOMY LAPAROSCOPIC;  Surgeon: Ovidio Kin, MD;  Location: [facility];  Service: General;  Laterality: N/A;   Social History   Social History Narrative   Does Gutter Work. Climbs Ladders.   Alcohol: 3-5 Beers each week day.                Friday Nights: 12 pack                Saturday: 12 pack                Sundays: None   Says that in past drank more. And also drank liquor in past but rarely drinks any liquor now.      Never smoked.    Single.    Has Girlfriend.   Has lost 25 lbs in past year secondary to diet changes-stopped eating late night.(entered 07/2013)   Was lifting weights until past 2 months-stopped sec to pain.   Immunization History  Administered Date(s) Administered  . Tdap 01/31/2016     Objective: Vital Signs: There were no vitals taken for this visit.   Physical Exam   Musculoskeletal Exam: ***  CDAI Exam:  CDAI Score: Not documented Patient Global Assessment: Not documented; Provider Global Assessment: Not documented Swollen: Not documented; Tender: Not documented Joint Exam   Not documented   There is currently no information documented on the homunculus. Go to the Rheumatology activity and complete the homunculus joint exam.  Investigation: No additional findings.  Imaging: No results found.  Recent Labs: Lab Results  Component Value Date   WBC 5.8 10/15/2018   HGB 15.9 10/15/2018   PLT 214 10/15/2018   NA 138 10/15/2018   K 4.5 10/15/2018   CL 105 10/15/2018   CO2 24 10/15/2018   GLUCOSE 88 10/15/2018   BUN 18 10/15/2018   CREATININE 1.03 10/15/2018    BILITOT 0.6 10/15/2018   ALKPHOS 66 04/10/2016   AST 25 10/15/2018   ALT 59 (H) 10/15/2018   PROT 6.9 10/15/2018   ALBUMIN 4.5 04/10/2016   CALCIUM 9.7 10/15/2018   GFRAA 99 10/15/2018   QFTBGOLDPLUS NEGATIVE 02/13/2018    Speciality Comments: No specialty comments available.  Procedures:  No procedures performed Allergies: Patient has no known allergies.   Assessment / Plan:     Visit Diagnoses: No diagnosis found.   Orders: No orders of the defined types were placed in this encounter.  No orders of the defined types were placed in this encounter.   Face-to-face time spent with patient was *** minutes. Greater than 50% of time was spent in counseling and coordination of care.  Follow-Up Instructions: No follow-ups on file.   Gearldine Bienenstockaylor M Audra Kagel, PA-C  Note - This record has been created using Dragon software.  Chart creation errors have been sought, but may not always  have been located. Such creation errors do not reflect on  the standard of medical care.

## 2019-04-16 ENCOUNTER — Ambulatory Visit: Payer: Self-pay | Admitting: Physician Assistant

## 2019-05-22 ENCOUNTER — Telehealth: Payer: Self-pay | Admitting: Rheumatology

## 2019-05-22 NOTE — Telephone Encounter (Signed)
Patient advised he due for labs and a follow up visit. Patient scheduled for 05/23/19 at 9:40 am.

## 2019-05-22 NOTE — Progress Notes (Signed)
Office Visit Note  Patient: John Blackwell             Date of Birth: 08-06-1971           MRN: [id number]             PCP: Deon Pilling Referring: Deon Pilling Visit Date: 05/23/2019 Occupation: @GUAROCC @  Subjective:  Pain in both wrist joints   History of Present Illness: John Blackwell is a 48 y.o. male with history of seropositive rheumatoid arthritis.  He is prescribed Enbrel 50 mg sq injection once weekly.  He is due to update lab work and requires refills of Enbrel today.  He has missed 3 doses of Enbrel recently.  He is having increased discomfort in bilateral hands and bilateral wrist joints.  He takes Tylenol very intermittently for pain relief.  He states he is having some symptoms of carpal tunnel bilaterally.  He does perform overuse activities at work.  He has intermittent right shoulder joint pain but continues to have good range of motion.  He feels as though Enbrel has been effective and would like a refill today.    Activities of Daily Living:  Patient reports morning stiffness for 15-20 minutes.   Patient Reports nocturnal pain.  Difficulty dressing/grooming: Denies Difficulty climbing stairs: Denies Difficulty getting out of chair: Denies Difficulty using hands for taps, buttons, cutlery, and/or writing: Denies  Review of Systems  Constitutional: Negative for fatigue and night sweats.  HENT: Negative for mouth sores, mouth dryness and nose dryness.   Eyes: Negative for pain, redness, itching and dryness.  Respiratory: Negative for cough, shortness of breath, wheezing and difficulty breathing.   Cardiovascular: Negative for chest pain, palpitations, hypertension, irregular heartbeat and swelling in legs/feet.  Gastrointestinal: Negative for abdominal pain, blood in stool, constipation and diarrhea.  Endocrine: Negative for increased urination.  Genitourinary: Negative for painful urination and pelvic pain.  Musculoskeletal: Positive for  arthralgias, joint pain and morning stiffness. Negative for joint swelling, myalgias, muscle weakness, muscle tenderness and myalgias.  Skin: Positive for rash. Negative for color change, hair loss, nodules/bumps, skin tightness, ulcers and sensitivity to sunlight.  Allergic/Immunologic: Negative for susceptible to infections.  Neurological: Negative for dizziness, fainting, headaches, memory loss, night sweats and weakness.  Hematological: Negative for bruising/bleeding tendency and swollen glands.  Psychiatric/Behavioral: Negative for depressed mood, confusion and sleep disturbance. The patient is not nervous/anxious.     PMFS History:  Patient Active Problem List   Diagnosis Date Noted  . Ruptured suppurative appendicitis 04/13/2016  . Right flank mass 10/14/2014  . RA (rheumatoid arthritis) (HCC) 08/27/2014  . Rheumatoid factor positive 12/19/2013  . Arthralgia of multiple sites 11/18/2013  . Arthritis 08/06/2013  . 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 Alcohol abuse     Past Medical History:  Diagnosis Date  . Alcohol abuse   . Arthritis 08/06/2013   OA of hands,feet, knees  . 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 RA (rheumatoid arthritis) (HCC)     Family History  Problem Relation Age of Onset  . Fibromyalgia Mother   . Thyroid disease Brother   . Healthy Daughter    Past Surgical History:  Procedure Laterality Date  . 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  Alcohol: 3-5 Beers each week day.                Friday Nights: 12 pack                Saturday: 12 pack                Sundays: None   Says that in past drank more. And also drank liquor in past but rarely drinks any liquor now.      Never smoked.    Single.    Has Girlfriend.   Has lost 25 lbs in past  year secondary to diet changes-stopped eating late night.(entered 07/2013)   Was lifting weights until past 2 months-stopped sec to pain.   Immunization History  Administered Date(s) Administered  . Tdap 01/31/2016     Objective: Vital Signs: BP (!) 137/92 (BP Location: Left Arm, Patient Position: Sitting, Cuff Size: Normal)   Pulse 73   Resp 15   Ht 5\' 8"  (1.727 m)   Wt 239 lb (108.4 kg)   BMI 36.34 kg/m    Physical Exam Vitals signs and nursing note reviewed.  Constitutional:      Appearance: He is well-developed.  HENT:     Head: Normocephalic and atraumatic.  Eyes:     Conjunctiva/sclera: Conjunctivae normal.     Pupils: Pupils are equal, round, and reactive to light.  Neck:     Musculoskeletal: Normal range of motion and neck supple.  Cardiovascular:     Rate and Rhythm: Normal rate and regular rhythm.     Heart sounds: Normal heart sounds.  Pulmonary:     Effort: Pulmonary effort is normal.     Breath sounds: Normal breath sounds.  Abdominal:     General: Bowel sounds are normal.     Palpations: Abdomen is soft.  Skin:    General: Skin is warm and dry.     Capillary Refill: Capillary refill takes less than 2 seconds.  Neurological:     Mental Status: He is alert and oriented to person, place, and time.  Psychiatric:        Behavior: Behavior normal.      Musculoskeletal Exam: C-spine, thoracic spine, lumbar spine good range of motion.  No midline spinal tenderness.  No SI joint tenderness.  Shoulder joints, elbow joints, wrist joints, MCPs and PIPs and DIPs good range of motion no synovitis.  He has complete fist formation bilaterally.  Hip joints, knee joints, ankle joints, MTPs, PIPs, DIPs good range of motion with no synovitis.  No warmth or effusion of bilateral knee joints.  No tenderness or swelling of ankle joints.  CDAI Exam: CDAI Score: 0.8  Patient Global: 4 mm; Provider Global: 4 mm Swollen: 0 ; Tender: 0  Joint Exam   No joint exam has been  documented for this visit   There is currently no information documented on the homunculus. Go to the Rheumatology activity and complete the homunculus joint exam.  Investigation: No additional findings.  Imaging: No results found.  Recent Labs: Lab Results  Component Value Date   WBC 5.8 10/15/2018   HGB 15.9 10/15/2018   PLT 214 10/15/2018   NA 138 10/15/2018   K 4.5 10/15/2018   CL 105 10/15/2018   CO2 24 10/15/2018   GLUCOSE 88 10/15/2018   BUN 18 10/15/2018   CREATININE 1.03 10/15/2018   BILITOT 0.6 10/15/2018   ALKPHOS 66 04/10/2016   AST 25 10/15/2018   ALT 59 (H)  10/15/2018   PROT 6.9 10/15/2018   ALBUMIN 4.5 04/10/2016   CALCIUM 9.7 10/15/2018   GFRAA 99 10/15/2018   QFTBGOLDPLUS NEGATIVE 02/13/2018    Speciality Comments: No specialty comments available.  Procedures:  No procedures performed Allergies: Patient has no known allergies.   Assessment / Plan:     Visit Diagnoses: Rheumatoid arthritis with rheumatoid factor of multiple sites without organ or systems involvement ([place]) - RF+ and CCP +: He has no synovitis on exam.  He has not had any recent rheumatoid arthritis flares.  He has missed 3 doses of Enbrel recently due to being due for lab work and refills.  We will obtain CBC, CMP, and TB gold today.  We will send in a refill of Enbrel once updated.  He is having some increased discomfort in bilateral hands and bilateral wrist joints.  He has no tenderness or synovitis on exam today.  He has morning stiffness lasting 15 to 20 minutes.  He has intermittent right shoulder joint pain but is good range of motion with no tenderness or effusion on exam today.  He will continue on Enbrel 50 mg subcutaneous injections every week. He was advised to notify us if he develops increased joint pain or joint swelling.  He will follow-up in the office in 5 months.  High risk medication use - Enbrel 50 mg sq injections once weekly. D/c MTX due to elevated LFTs-He continues to  drink 2-3 beers per night.  CBC and CMP were drawn today to monitor for drug toxicity.  He will return in October and every 3 months for lab work to monitor for drug toxicity.  TB gold will be updated today.  He was advised to hold Enbrel anytime he develops signs or symptoms of an infection and to resume once the infection has completely cleared.  We discussed the importance of social distancing and following the standard precautions recommended by the CDC.  I discussed the importance of having yearly skin exams while on Enbrel due to the increased risk of melanoma- Plan: COMPLETE METABOLIC PANEL WITH GFR, CBC with Differential/Platelet, QuantiFERON-TB Gold Plus  Other fatigue - He has no fatigue at this time.   Alcohol abuse - He continues to drink alcohol on a daily basis.   Orders: Orders Placed This Encounter  Procedures  . COMPLETE METABOLIC PANEL WITH GFR  . CBC with Differential/Platelet  . QuantiFERON-TB Gold Plus   No orders of the defined types were placed in this encounter.    Follow-Up Instructions: Return in about 5 months (around 10/23/2019) for Rheumatoid arthritis.   Ofilia Neas, PA-C  Note - This record has been created using Dragon software.  Chart creation errors have been sought, but may not always  have been located. Such creation errors do not reflect on  the standard of medical care.

## 2019-05-22 NOTE — Telephone Encounter (Signed)
Patient called, and [facility] called requesting a refill on Enbrel. Please fax new rx to [facility] [phone], or call [phone]

## 2019-05-23 ENCOUNTER — Encounter: Payer: Self-pay | Admitting: Physician Assistant

## 2019-05-23 ENCOUNTER — Ambulatory Visit: Payer: Self-pay | Admitting: Physician Assistant

## 2019-05-23 ENCOUNTER — Other Ambulatory Visit: Payer: Self-pay

## 2019-05-23 VITALS — BP 137/92 | HR 73 | Resp 15 | Ht 68.0 in | Wt 239.0 lb

## 2019-05-23 DIAGNOSIS — F1010 Alcohol abuse, uncomplicated: Secondary | ICD-10-CM

## 2019-05-23 DIAGNOSIS — R5383 Other fatigue: Secondary | ICD-10-CM

## 2019-05-23 DIAGNOSIS — Z79899 Other long term (current) drug therapy: Secondary | ICD-10-CM

## 2019-05-23 DIAGNOSIS — M0579 Rheumatoid arthritis with rheumatoid factor of multiple sites without organ or systems involvement: Secondary | ICD-10-CM

## 2019-05-23 NOTE — Patient Instructions (Signed)
Standing Labs We placed an order today for your standing lab work.    Please come back and get your standing labs in October and every 3 months   We have open lab daily Monday through Thursday from 8:30-12:30 PM and 1:30-4:30 PM and Friday from 8:30-12:30 PM and 1:30 -4:00 PM at the office of Dr. Shaili Deveshwar.   You may experience shorter wait times on Monday and Friday afternoons. The office is located at [address], [address], [place], [place] [address] No appointment is necessary.   Labs are drawn by [facility].  You may receive a bill from [facility] for your lab work.  If you wish to have your labs drawn at another location, please call the office 24 hours in advance to send orders.  If you have any questions regarding directions or hours of operation,  please call [phone].   Just as a reminder please drink plenty of water prior to coming for your lab work. Thanks!   

## 2019-05-25 LAB — CBC WITH DIFFERENTIAL/PLATELET
Absolute Monocytes: 614 cells/uL (ref 200–950)
Basophils Absolute: 53 cells/uL (ref 0–200)
Basophils Relative: 0.8 %
Eosinophils Absolute: 271 cells/uL (ref 15–500)
Eosinophils Relative: 4.1 %
HCT: 46.4 % (ref 38.5–50.0)
Hemoglobin: 15.5 g/dL (ref 13.2–17.1)
Lymphs Abs: 2039 cells/uL (ref 850–3900)
MCH: 29.2 pg (ref 27.0–33.0)
MCHC: 33.4 g/dL (ref 32.0–36.0)
MCV: 87.5 fL (ref 80.0–100.0)
MPV: 11 fL (ref 7.5–12.5)
Monocytes Relative: 9.3 %
Neutro Abs: 3623 cells/uL (ref 1500–7800)
Neutrophils Relative %: 54.9 %
Platelets: 206 10*3/uL (ref 140–400)
RBC: 5.3 10*6/uL (ref 4.20–5.80)
RDW: 12.7 % (ref 11.0–15.0)
Total Lymphocyte: 30.9 %
WBC: 6.6 10*3/uL (ref 3.8–10.8)

## 2019-05-25 LAB — COMPLETE METABOLIC PANEL WITH GFR
AG Ratio: 1.9 (calc) (ref 1.0–2.5)
ALT: 86 U/L — ABNORMAL HIGH (ref 9–46)
AST: 32 U/L (ref 10–40)
Albumin: 4.5 g/dL (ref 3.6–5.1)
Alkaline phosphatase (APISO): 52 U/L (ref 36–130)
BUN: 15 mg/dL (ref 7–25)
CO2: 25 mmol/L (ref 20–32)
Calcium: 9.6 mg/dL (ref 8.6–10.3)
Chloride: 105 mmol/L (ref 98–110)
Creat: 0.91 mg/dL (ref 0.60–1.35)
GFR, Est African American: 115 mL/min/{1.73_m2} (ref >=60)
GFR, Est Non African American: 99 mL/min/{1.73_m2} (ref >=60)
Globulin: 2.4 g/dL (calc) (ref 1.9–3.7)
Glucose, Bld: 90 mg/dL (ref 65–99)
Potassium: 4.3 mmol/L (ref 3.5–5.3)
Sodium: 138 mmol/L (ref 135–146)
Total Bilirubin: 0.5 mg/dL (ref 0.2–1.2)
Total Protein: 6.9 g/dL (ref 6.1–8.1)

## 2019-05-25 LAB — QUANTIFERON-TB GOLD PLUS
Mitogen-NIL: 7.06 IU/mL
NIL: 0.02 IU/mL
QuantiFERON-TB Gold Plus: NEGATIVE
TB1-NIL: 0 IU/mL
TB2-NIL: 0.01 IU/mL

## 2019-05-26 NOTE — Progress Notes (Signed)
ALT is elevated-86-trending up.  AST WNL.  Please advise patient to avoid tylenol, NSAIDs, and alcohol.  Please forward labs to PCP.  Rest of CMP WNL.  CBC WNL.  TB gold negative.

## 2019-05-27 ENCOUNTER — Telehealth: Payer: Self-pay | Admitting: Rheumatology

## 2019-05-27 NOTE — Telephone Encounter (Signed)
Patient called to check on his prescription of Enbrel to be sent to [facility].  Patient requesting a return call.

## 2019-05-27 NOTE — Telephone Encounter (Signed)
Patient advised we have faxed prescription to [employer]. Patient verbalized understanding.

## 2019-10-06 ENCOUNTER — Other Ambulatory Visit: Payer: Self-pay

## 2019-10-06 ENCOUNTER — Telehealth: Payer: Self-pay | Admitting: Rheumatology

## 2019-10-06 DIAGNOSIS — Z79899 Other long term (current) drug therapy: Secondary | ICD-10-CM

## 2019-10-06 NOTE — Telephone Encounter (Signed)
Patient going to [facility] on [address] for labs. Please release orders.

## 2019-10-06 NOTE — Telephone Encounter (Signed)
Lab orders released for [facility].  

## 2019-10-09 ENCOUNTER — Other Ambulatory Visit: Payer: Self-pay

## 2019-10-09 LAB — CBC WITH DIFFERENTIAL/PLATELET
Absolute Monocytes: 411 cells/uL (ref 200–950)
Basophils Absolute: 31 cells/uL (ref 0–200)
Basophils Relative: 0.6 %
Eosinophils Absolute: 312 cells/uL (ref 15–500)
Eosinophils Relative: 6 %
HCT: 45.8 % (ref 38.5–50.0)
Hemoglobin: 15.1 g/dL (ref 13.2–17.1)
Lymphs Abs: 1851 cells/uL (ref 850–3900)
MCH: 28.3 pg (ref 27.0–33.0)
MCHC: 33 g/dL (ref 32.0–36.0)
MCV: 85.9 fL (ref 80.0–100.0)
MPV: 11.3 fL (ref 7.5–12.5)
Monocytes Relative: 7.9 %
Neutro Abs: 2595 cells/uL (ref 1500–7800)
Neutrophils Relative %: 49.9 %
Platelets: 197 10*3/uL (ref 140–400)
RBC: 5.33 10*6/uL (ref 4.20–5.80)
RDW: 12.7 % (ref 11.0–15.0)
Total Lymphocyte: 35.6 %
WBC: 5.2 10*3/uL (ref 3.8–10.8)

## 2019-10-09 LAB — COMPLETE METABOLIC PANEL WITH GFR
AG Ratio: 2 (calc) (ref 1.0–2.5)
ALT: 59 U/L — ABNORMAL HIGH (ref 9–46)
AST: 27 U/L (ref 10–40)
Albumin: 4.5 g/dL (ref 3.6–5.1)
Alkaline phosphatase (APISO): 56 U/L (ref 36–130)
BUN: 16 mg/dL (ref 7–25)
CO2: 25 mmol/L (ref 20–32)
Calcium: 9.4 mg/dL (ref 8.6–10.3)
Chloride: 104 mmol/L (ref 98–110)
Creat: 1.09 mg/dL (ref 0.60–1.35)
GFR, Est African American: 92 mL/min/{1.73_m2} (ref >=60)
GFR, Est Non African American: 79 mL/min/{1.73_m2} (ref >=60)
Globulin: 2.3 g/dL (calc) (ref 1.9–3.7)
Glucose, Bld: 111 mg/dL (ref 65–139)
Potassium: 4.2 mmol/L (ref 3.5–5.3)
Sodium: 139 mmol/L (ref 135–146)
Total Bilirubin: 0.6 mg/dL (ref 0.2–1.2)
Total Protein: 6.8 g/dL (ref 6.1–8.1)

## 2019-10-09 MED ORDER — ENBREL MINI 50 MG/ML ~~LOC~~ SOCT: 50.0000 mg | SUBCUTANEOUS | 0 refills | Status: DC

## 2019-10-09 NOTE — Progress Notes (Signed)
ALT is elevated but trending down. Please advise patient to avoid taking NSAIDs, tylenol, and alcohol.  Please forward labs to PCP. Rest of CMP WNL.   CBC WNL.

## 2019-10-09 NOTE — Progress Notes (Signed)
Advised patient of lab results and he requested refill of enbrel to be sent to [employer].   Last Visit: 05/23/2019 Next Visit: 10/21/2019 Labs: 10/08/2019 ALT is elevated but trending down. Please advise patient to avoid taking NSAIDs, tylenol, and alcohol. Please forward labs to PCP. Rest of CMP WNL.  CBC WNL. TB Gold: 05/23/2019 negative   Okay to refill per Dr. Estanislado Pandy.

## 2019-10-14 ENCOUNTER — Telehealth: Payer: Self-pay | Admitting: Pharmacy Technician

## 2019-10-14 NOTE — Telephone Encounter (Signed)
Patient's provider portion has been completed and submitted to [employer] for 2021 renewal. Atempted to call patient to discuss his portion and required income documents needed- no answer, vm full. Patient has an appt on 12/22.  10:49 AM Beatriz Chancellor, CPhT

## 2019-10-16 NOTE — Progress Notes (Signed)
Virtual Visit via Telephone Note  I connected with John Blackwell on 10/21/19 at  9:45 AM EST by telephone and verified that I am speaking with the correct person using two identifiers.  Location: Patient: Home  Provider: Clinic  This service was conducted via virtual visit.   The patient was located at home. I was located in my office.  Consent was obtained prior to the virtual visit and is aware of possible charges through their insurance for this visit.  The patient is an established patient.  Dr. Corliss Skains, MD conducted the virtual visit and Sherron Ales, PA-C acted as scribe during the service.  Office staff helped with scheduling follow up visits after the service was conducted.     I discussed the limitations, risks, security and privacy concerns of performing an evaluation and management service by telephone and the availability of in person appointments. I also discussed with the patient that there may be a patient responsible charge related to this service. The patient expressed understanding and agreed to proceed.  CC: Medication monitoring  History of Present Illness: Patient is a 48 year old male with a past medical history of seropositive rheumatoid arthritis.  He is on enbrel 50 mg subcutaneous injections once weekly. He is on Enbrel 50 mg subcutaneous injections once weekly.  His last injection was 2 weeks ago due to running out of pens.  He has morning stiffness and discomfort for about 15 minutes in the morning.  He has no joint swelling currently.    Review of Systems  Constitutional: Negative for fever and malaise/fatigue.  HENT: Negative for congestion.   Eyes: Negative for photophobia, pain, discharge and redness.  Respiratory: Negative for cough, shortness of breath and wheezing.   Cardiovascular: Negative for chest pain, palpitations and leg swelling.  Gastrointestinal: Negative for blood in stool, constipation and diarrhea.  Genitourinary: Negative for dysuria and  frequency.  Musculoskeletal: Positive for joint pain. Negative for back pain, myalgias and neck pain.  Skin: Negative for rash.  Neurological: Negative for dizziness, weakness and headaches.  Endo/Heme/Allergies: Does not bruise/bleed easily.  Psychiatric/Behavioral: Negative for depression and memory loss. The patient is not nervous/anxious and does not have insomnia.       Observations/Objective: Physical Exam  Constitutional: He is oriented to person, place, and time.  Neurological: He is alert and oriented to person, place, and time.  Psychiatric: Mood, memory, affect and judgment normal.   Patient reports morning stiffness for 15 minutes.   Patient reports nocturnal pain.  Difficulty dressing/grooming: Denies Difficulty climbing stairs: Denies Difficulty getting out of chair: Denies Difficulty using hands for taps, buttons, cutlery, and/or writing: Denies   Assessment and Plan: Visit Diagnoses: Rheumatoid arthritis with rheumatoid factor of multiple sites without organ or systems involvement (HCC) - RF+ and CCP +: He has not had any recent rheumatoid arthritis flares.  He experiences morning stiffness and discomfort for about 15 minutes daily.  He has no joint inflammation at this time.  He is clinically doing well on Enbrel 50 mg subcutaneous injections every week.  His last dose was two weeks ago due to requiring a prescription refill.  We offered for him to come by the office for a sample of Enbrel.  He will continue on the current treatment regimen.  He was advised to notify us if he develops increased joint pain or joint swelling.  He will follow up in 4-5 months.   High risk medication use - Enbrel 50 mg sq injections once  weekly. D/c MTX due to elevated LFTs-He continues to drink 2-3 beers per night. CBC and CMP were drawn on 10/08/19.  He will be due to update lab work in March and every 3 months. TB gold negative on 05/23/19.   Other fatigue - He has no fatigue at this time.    Alcohol abuse - He continues to drink alcohol on a regular basis.  We discussed the importance of avoiding alcohol use due to elevated LFTs.  Follow Up Instructions: He will follow up in 4-5 months.    I discussed the assessment and treatment plan with the patient. The patient was provided an opportunity to ask questions and all were answered. The patient agreed with the plan and demonstrated an understanding of the instructions.   The patient was advised to call back or seek an in-person evaluation if the symptoms worsen or if the condition fails to improve as anticipated.  I provided 15 minutes of non-face-to-face time during this encounter.   Bo Merino, MD   Scribed by-  Hazel Sams, PA-C

## 2019-10-21 ENCOUNTER — Other Ambulatory Visit: Payer: Self-pay

## 2019-10-21 ENCOUNTER — Telehealth (INDEPENDENT_AMBULATORY_CARE_PROVIDER_SITE_OTHER): Payer: Self-pay | Admitting: Rheumatology

## 2019-10-21 ENCOUNTER — Telehealth: Payer: Self-pay | Admitting: Pharmacist

## 2019-10-21 ENCOUNTER — Encounter: Payer: Self-pay | Admitting: Rheumatology

## 2019-10-21 DIAGNOSIS — A6002 Herpesviral infection of other male genital organs: Secondary | ICD-10-CM

## 2019-10-21 DIAGNOSIS — R5383 Other fatigue: Secondary | ICD-10-CM

## 2019-10-21 DIAGNOSIS — Z79899 Other long term (current) drug therapy: Secondary | ICD-10-CM

## 2019-10-21 DIAGNOSIS — F1010 Alcohol abuse, uncomplicated: Secondary | ICD-10-CM

## 2019-10-21 DIAGNOSIS — M0579 Rheumatoid arthritis with rheumatoid factor of multiple sites without organ or systems involvement: Secondary | ICD-10-CM

## 2019-10-21 NOTE — Telephone Encounter (Signed)
Medication Samples have been provided to the patient.  Drug name: Enbrel Mini Cartridge Strength: 50 mg Qty: 2   LOT: [id number]   Exp.Date: 07/2020  Dosing instructions: Inject 1 cartridge every 7 days  The patient has been instructed regarding the correct time, dose, and frequency of taking this medication, including desired effects and most common side effects.   Instructed patient to call [employer] to schedule shipment as current PAP ends 10/30/2019.  Also instructed patient to discuss reapplying.  He was given paper application to submit if he is unable to do so over the phone or online.  Patient verbalized understanding.   Mariella Saa, PharmD, [place], [place] Pharmacist [phone]  10/21/2019 3:45 PM

## 2019-10-21 NOTE — Telephone Encounter (Signed)
Patient is out of Enbrel.  His last injection was two weeks ago. Called patient to notify that he may come to the office for samples for 2 week supply.  He is approved for [employer] through 10/30/2019.  Advised that he may still schedule a shipment of medication before his coverage ends.  Gave him the number to [employer] [phone] to call and schedule shipment.  He also needs to renew his application.  Advised patient he may reapply online and can also see if he can renew over the phone when he calls to schedule shipment. Patient verbalized understanding.  All questions encouraged and answered.  Instructed patient to call with any other questions or concerns.  Mariella Saa, PharmD, [place], [place] Clinical Specialty Pharmacist [phone]  10/21/2019 11:47 AM

## 2019-11-04 ENCOUNTER — Telehealth: Payer: Self-pay | Admitting: Rheumatology

## 2019-11-04 NOTE — Telephone Encounter (Signed)
-----   Message from Audrie Lia, RT sent at 10/21/2019 11:48 AM EST ----- Regarding: 4-5 MONTH F/U

## 2019-11-04 NOTE — Telephone Encounter (Signed)
I LMOM for patient to call, and schedule a follow up appointment for 5 months (around 03/20/2020).

## 2019-11-10 NOTE — Telephone Encounter (Signed)
Submitted Patient Assistance Application to [employer] for ENBREL along with provider portion, PA and income documents. Will follow up to confirm receipt. Will update patient when we receive a response.  Will send documents to scan center.  Fax# [phone] Phone# [phone]

## 2019-11-21 NOTE — Telephone Encounter (Signed)
Called [employer], patient's application is under review. All required information that is needed has been submitted.

## 2019-11-25 NOTE — Telephone Encounter (Signed)
Received a fax from  [employer] regarding an approval for ENBREL from 11/25/2019 to 11/24/2020.   Reference number: [id number]  Will send document to scan center.  Verlin Fester, PharmD, [place], [facility] Clinical Specialty Pharmacist [phone]  11/25/2019 8:51 AM

## 2020-03-24 ENCOUNTER — Telehealth: Payer: Self-pay | Admitting: Rheumatology

## 2020-03-24 ENCOUNTER — Other Ambulatory Visit: Payer: Self-pay | Admitting: *Deleted

## 2020-03-24 DIAGNOSIS — Z79899 Other long term (current) drug therapy: Secondary | ICD-10-CM

## 2020-03-24 NOTE — Telephone Encounter (Signed)
Lab orders released.  

## 2020-03-24 NOTE — Telephone Encounter (Signed)
Patient at [facility] now. Please release labs.

## 2020-03-25 LAB — COMPLETE METABOLIC PANEL WITH GFR
AG Ratio: 1.9 (calc) (ref 1.0–2.5)
ALT: 76 U/L — ABNORMAL HIGH (ref 9–46)
AST: 34 U/L (ref 10–40)
Albumin: 4.6 g/dL (ref 3.6–5.1)
Alkaline phosphatase (APISO): 51 U/L (ref 36–130)
BUN: 16 mg/dL (ref 7–25)
CO2: 27 mmol/L (ref 20–32)
Calcium: 9.6 mg/dL (ref 8.6–10.3)
Chloride: 103 mmol/L (ref 98–110)
Creat: 1.06 mg/dL (ref 0.60–1.35)
GFR, Est African American: 95 mL/min/{1.73_m2} (ref >=60)
GFR, Est Non African American: 82 mL/min/{1.73_m2} (ref >=60)
Globulin: 2.4 g/dL (calc) (ref 1.9–3.7)
Glucose, Bld: 92 mg/dL (ref 65–99)
Potassium: 4.3 mmol/L (ref 3.5–5.3)
Sodium: 137 mmol/L (ref 135–146)
Total Bilirubin: 0.7 mg/dL (ref 0.2–1.2)
Total Protein: 7 g/dL (ref 6.1–8.1)

## 2020-03-25 LAB — CBC WITH DIFFERENTIAL/PLATELET
Absolute Monocytes: 620 cells/uL (ref 200–950)
Basophils Absolute: 59 cells/uL (ref 0–200)
Basophils Relative: 1 %
Eosinophils Absolute: 271 cells/uL (ref 15–500)
Eosinophils Relative: 4.6 %
HCT: 47.1 % (ref 38.5–50.0)
Hemoglobin: 15.5 g/dL (ref 13.2–17.1)
Lymphs Abs: 2336 cells/uL (ref 850–3900)
MCH: 28.7 pg (ref 27.0–33.0)
MCHC: 32.9 g/dL (ref 32.0–36.0)
MCV: 87.2 fL (ref 80.0–100.0)
MPV: 11.3 fL (ref 7.5–12.5)
Monocytes Relative: 10.5 %
Neutro Abs: 2614 cells/uL (ref 1500–7800)
Neutrophils Relative %: 44.3 %
Platelets: 187 10*3/uL (ref 140–400)
RBC: 5.4 10*6/uL (ref 4.20–5.80)
RDW: 12.7 % (ref 11.0–15.0)
Total Lymphocyte: 39.6 %
WBC: 5.9 10*3/uL (ref 3.8–10.8)

## 2020-03-25 NOTE — Progress Notes (Signed)
CBC is normal, CMP indicates elevated LFTs.  Patient consumes alcohol.  Please notify patient of elevated LFTs and cutting back on alcohol intake.  Please forward labs to his PCP.

## 2020-03-30 NOTE — Progress Notes (Signed)
Office Visit Note  Patient: John Blackwell             Date of Birth: 03/22/1971           MRN: [id number]             PCP: Rennis Golden Referring: Orlena Sheldon, PA-C Visit Date: 03/31/2020 Occupation: @GUAROCC @  Subjective:  Medication monitoring   History of Present Illness: John Blackwell is a 49 y.o. male with history of seropositive rheumatoid arthritis.  He is on enbrel 50 mg sq injections once weekly.  He has not missed any doses recently.  He has not had any recent infections.  He is apprehensive to receive the covid-19 vaccine but he is considering it.  He denies any recent rheumatoid arthritis flares.  He has occasional discomfort in both hands and both wrists, which is exacerbated by working his physically demanding job.    Activities of Daily Living:  Patient reports morning stiffness for 20 minute.   Patient Reports nocturnal pain.  Difficulty dressing/grooming: Denies Difficulty climbing stairs: Denies Difficulty getting out of chair: Denies Difficulty using hands for taps, buttons, cutlery, and/or writing: Denies  Review of Systems  Constitutional: Negative for fatigue and night sweats.  HENT: Negative for mouth sores, mouth dryness and nose dryness.   Eyes: Negative for redness and dryness.  Respiratory: Negative for shortness of breath and difficulty breathing.   Cardiovascular: Negative for chest pain, palpitations, hypertension, irregular heartbeat and swelling in legs/feet.  Gastrointestinal: Negative for constipation and diarrhea.  Endocrine: Negative for excessive thirst and increased urination.  Genitourinary: Negative for difficulty urinating and painful urination.  Musculoskeletal: Positive for arthralgias, joint pain, joint swelling and morning stiffness. Negative for myalgias, muscle weakness, muscle tenderness and myalgias.  Skin: Negative for color change, rash, hair loss, nodules/bumps, skin tightness, ulcers and sensitivity to  sunlight.  Allergic/Immunologic: Negative for susceptible to infections.  Neurological: Negative for dizziness, fainting, numbness, memory loss, night sweats and weakness.  Hematological: Negative for bruising/bleeding tendency and swollen glands.  Psychiatric/Behavioral: Negative for depressed mood and sleep disturbance. The patient is not nervous/anxious.     PMFS History:  Patient Active Problem List   Diagnosis Date Noted  . Ruptured suppurative appendicitis 04/13/2016  . Right flank mass 10/14/2014  . RA (rheumatoid arthritis) ([place]) 08/27/2014  . Rheumatoid factor positive 12/19/2013  . Arthralgia of multiple sites 11/18/2013  . Arthritis 08/06/2013  . 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 Alcohol abuse     Past Medical History:  Diagnosis Date  . Alcohol abuse   . Arthritis 08/06/2013   OA of hands,feet, knees  . 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 RA (rheumatoid arthritis) (H[facility])     Family History  Problem Relation Age of Onset  . Fibromyalgia Mother   . Thyroid disease Brother   . Healthy Daughter    Past Surgical History:  Procedure Laterality Date  . LAPAROSCOPIC APPENDECTOMY N/A 04/11/2016   Procedure: APPENDECTOMY LAPAROSCOPIC;  Surgeon: Alphonsa Overall, MD;  Location: [facility];  Service: General;  Laterality: N/A;   Social History   Social History Narrative   Does Gutter Work. Climbs Ladders.   Alcohol: 3-5 Beers each week day.                Friday Nights: 12 pack                Saturday: 12 pack  Sundays: None   Says that in past drank more. And also drank liquor in past but rarely drinks any liquor now.      Never smoked.    Single.    Has Girlfriend.   Has lost 25 lbs in past year secondary to diet changes-stopped eating late night.(entered 07/2013)   Was lifting weights until past 2 months-stopped sec to pain.   Immunization History  Administered Date(s) Administered  . Tdap 01/31/2016     Objective: Vital Signs: BP 114/70 (BP  Location: Left Arm, Patient Position: Sitting, Cuff Size: Normal)   Pulse 67   Resp 14   Ht 5\' 8"  (1.727 m)   Wt 242 lb 3.2 oz (109.9 kg)   BMI 36.83 kg/m    Physical Exam Vitals and nursing note reviewed.  Constitutional:      Appearance: He is well-developed.  HENT:     Head: Normocephalic and atraumatic.  Eyes:     Conjunctiva/sclera: Conjunctivae normal.     Pupils: Pupils are equal, round, and reactive to light.  Pulmonary:     Effort: Pulmonary effort is normal.  Abdominal:     General: Bowel sounds are normal.     Palpations: Abdomen is soft.  Musculoskeletal:     Cervical back: Normal range of motion and neck supple.  Skin:    General: Skin is warm and dry.     Capillary Refill: Capillary refill takes less than 2 seconds.  Neurological:     Mental Status: He is alert and oriented to person, place, and time.  Psychiatric:        Behavior: Behavior normal.      Musculoskeletal Exam: C-spine, thoracic spine, and lumbar spine good ROM.  Shoulder joints, elbow joints, wrist joints, MCPs, PIPs, and DIPs good ROM with no synovitis.  PIP and DIP thickening consistent with osteoarthritis of both hands. Complete fist formation bilaterally.  Hip joints, knee joints, ankle joints, MTPs, PIPs, and DIPs good ROM with no synovitis.  No warmth or effusion of knee joints.  No tenderness or swelling of ankle joints. PIP and DIP thickening consistent with osteoarthritis of both feet.   CDAI Exam: CDAI Score: -- Patient Global: --; Provider Global: -- Swollen: --; Tender: -- Joint Exam 03/31/2020   No joint exam has been documented for this visit   There is currently no information documented on the homunculus. Go to the Rheumatology activity and complete the homunculus joint exam.  Investigation: No additional findings.  Imaging: No results found.  Recent Labs: Lab Results  Component Value Date   WBC 5.9 03/24/2020   HGB 15.5 03/24/2020   PLT 187 03/24/2020   NA 137  03/24/2020   K 4.3 03/24/2020   CL 103 03/24/2020   CO2 27 03/24/2020   GLUCOSE 92 03/24/2020   BUN 16 03/24/2020   CREATININE 1.06 03/24/2020   BILITOT 0.7 03/24/2020   ALKPHOS 66 04/10/2016   AST 34 03/24/2020   ALT 76 (H) 03/24/2020   PROT 7.0 03/24/2020   ALBUMIN 4.5 04/10/2016   CALCIUM 9.6 03/24/2020   GFRAA 95 03/24/2020   QFTBGOLDPLUS NEGATIVE 05/23/2019    Speciality Comments: No specialty comments available.  Procedures:  No procedures performed Allergies: Patient has no known allergies.   Assessment / Plan:     Visit Diagnoses: Rheumatoid arthritis with rheumatoid factor of multiple sites without organ or systems involvement (HCC) - RF+, CCP+: He has no tenderness or synovitis on exam.  He has not had any  recent rheumatoid arthritis flares. He experiences occasional discomfort in both hands and both wrist joints, which is exacerbated by physical activity at work.  He has PIP and DIP thickening consistent with osteoarthritis of both hands but no inflammation or tenderness was noted.  He has complete fist formation bilaterally.  He is clinically doing well on Enbrel 50 mg sq injections once weekly.  He has not missed any doses.  He has not had any recent infections.  He was encouraged to receive the covid-19 vaccine.  He will continue on Enbrel as prescribed.  He does not need any refills at this time.  He was advised to notify us if he develops increased joint pain or joint swelling.  He will follow up in 5 months.   High risk medication use - Enbrel 50 mg sq injections once weekly. D/c MTX due to elevated LFTs.  ALT was 76 and AST was 34 on 03/24/2020.  Rest of CMP was within normal limits.  CBC was within normal limits.  He will return for lab work in August and every 3 months to monitor for drug toxicity.  Standing orders for CBC and CMP are in place.  TB gold was negative on 05/23/2019.  A future order for TB gold was placed today. He has not had any recent infections.  He was  strongly encouraged to receive the COVID-19 vaccination.  Other fatigue: He is not having any increased fatigue.   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  No orders of the defined types were placed in this encounter.    Follow-Up Instructions: Return in about 5 months (around 08/31/2020) for Rheumatoid arthritis.   Gearldine Bienenstock, PA-C  Note - This record has been created using Dragon software.  Chart creation errors have been sought, but may not always  have been located. Such creation errors do not reflect on  the standard of medical care.

## 2020-03-31 ENCOUNTER — Ambulatory Visit: Payer: Self-pay | Admitting: Physician Assistant

## 2020-03-31 ENCOUNTER — Other Ambulatory Visit: Payer: Self-pay

## 2020-03-31 ENCOUNTER — Encounter: Payer: Self-pay | Admitting: Physician Assistant

## 2020-03-31 VITALS — BP 114/70 | HR 67 | Resp 14 | Ht 68.0 in | Wt 242.2 lb

## 2020-03-31 DIAGNOSIS — R5383 Other fatigue: Secondary | ICD-10-CM

## 2020-03-31 DIAGNOSIS — Z79899 Other long term (current) drug therapy: Secondary | ICD-10-CM

## 2020-03-31 DIAGNOSIS — M0579 Rheumatoid arthritis with rheumatoid factor of multiple sites without organ or systems involvement: Secondary | ICD-10-CM

## 2020-03-31 NOTE — Patient Instructions (Signed)
Standing Labs We placed an order today for your standing lab work.    Please come back and get your standing labs in August and every 3 months   We have open lab daily Monday through Thursday from 8:30-12:30 PM and 1:30-4:30 PM and Friday from 8:30-12:30 PM and 1:30-4:00 PM at the office of Dr. Shaili Deveshwar.   You may experience shorter wait times on Monday and Friday afternoons. The office is located at [address], [address], [place], [place] [address] No appointment is necessary.   Labs are drawn by [facility].  You may receive a bill from [facility] for your lab work.  If you wish to have your labs drawn at another location, please call the office 24 hours in advance to send orders.  If you have any questions regarding directions or hours of operation,  please call [phone].   Just as a reminder please drink plenty of water prior to coming for your lab work. Thanks!   

## 2020-04-15 ENCOUNTER — Other Ambulatory Visit: Payer: Self-pay | Admitting: Rheumatology

## 2020-04-15 MED ORDER — ENBREL MINI 50 MG/ML ~~LOC~~ SOCT: 50.0000 mg | SUBCUTANEOUS | 0 refills | Status: DC

## 2020-04-15 NOTE — Telephone Encounter (Signed)
Patient called stating [employer] told him his Enbrel has been approved and need Dr. Corliss Skains to send a new prescription.  Please advise.

## 2020-04-15 NOTE — Telephone Encounter (Signed)
Last Visit: 03/31/2020 Next Visit: 08/31/2020 Labs: 03/24/2020 CBC is normal, CMP indicates elevated LFTs. TB Gold: 05/23/2019 Neg   Current Dose per office note on 03/31/2020: Enbrel 50 mg sq injections once weekly.  Okay to refill Enbrel?

## 2020-06-14 ENCOUNTER — Telehealth: Payer: Self-pay | Admitting: Rheumatology

## 2020-06-14 NOTE — Telephone Encounter (Signed)
Patient's mom John Blackwell called stating she has questions regarding the COVID vaccine and patient's current medications.  John Blackwell states she is worried because patient has not received his COVID vaccine.  John Blackwell states patient would not give her the names of the medications that he is prescribed and she is worried that they are weakening his immune system.

## 2020-06-15 NOTE — Telephone Encounter (Signed)
Unable to contact patient's mother with requested information as patient does not want his mother to know.

## 2020-07-12 ENCOUNTER — Other Ambulatory Visit: Payer: Self-pay | Admitting: *Deleted

## 2020-07-12 ENCOUNTER — Telehealth: Payer: Self-pay | Admitting: Rheumatology

## 2020-07-12 DIAGNOSIS — Z79899 Other long term (current) drug therapy: Secondary | ICD-10-CM

## 2020-07-12 NOTE — Telephone Encounter (Signed)
Lab Orders released.  

## 2020-07-12 NOTE — Telephone Encounter (Signed)
Patient called stating he is at [facility] on [address] and needs his labwork orders sent ASAP.

## 2020-07-13 ENCOUNTER — Other Ambulatory Visit: Payer: Self-pay | Admitting: *Deleted

## 2020-07-13 DIAGNOSIS — R899 Unspecified abnormal finding in specimens from other organs, systems and tissues: Secondary | ICD-10-CM

## 2020-07-13 NOTE — Progress Notes (Signed)
Office Visit Note  Patient: John Blackwell             Date of Birth: Jul 24, 1971           MRN: [id number]             PCP: Deon Pilling Referring: Dorena Bodo, PA-C Visit Date: 07/14/2020 Occupation: @GUAROCC @  Subjective:  Rheumatoid Arthritis (Doing good)   History of Present Illness: John Blackwell is a 49 y.o. male with history of rheumatoid arthritis.  He states he does not have any joint pain or joint swelling.  He has morning stiffness lasting for about 20 minutes.  He has been tolerating Enbrel well.  He ran out of Enbrel for couple of weeks.  He has not noticed any increased swelling.  Activities of Daily Living:  Patient reports morning stiffness for 20 minutes.   Patient Reports nocturnal pain.  Difficulty dressing/grooming: Denies Difficulty climbing stairs: Denies Difficulty getting out of chair: Denies Difficulty using hands for taps, buttons, cutlery, and/or writing: Denies  Review of Systems  Constitutional: Negative for fatigue.  HENT: Negative for mouth dryness.   Eyes: Negative for dryness.  Respiratory: Negative for shortness of breath.   Cardiovascular: Negative for swelling in legs/feet.  Gastrointestinal: Negative for constipation.  Endocrine: Negative for excessive thirst.  Genitourinary: Negative for difficulty urinating.  Musculoskeletal: Positive for arthralgias, joint pain and morning stiffness. Negative for joint swelling.  Skin: Negative for rash.  Allergic/Immunologic: Negative for susceptible to infections.  Neurological: Negative for numbness.  Hematological: Negative for bruising/bleeding tendency.  Psychiatric/Behavioral: Negative for sleep disturbance.    PMFS History:  Patient Active Problem List   Diagnosis Date Noted  . Ruptured suppurative appendicitis 04/13/2016  . Right flank mass 10/14/2014  . RA (rheumatoid arthritis) ([facility]) 08/27/2014  . Rheumatoid factor positive 12/19/2013  . Arthralgia of multiple  sites 11/18/2013  . Arthritis 08/06/2013  . 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 Alcohol abuse     Past Medical History:  Diagnosis Date  . Alcohol abuse   . Arthritis 08/06/2013   OA of hands,feet, knees  . 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 RA (rheumatoid arthritis) (H[facility])     Family History  Problem Relation Age of Onset  . Fibromyalgia Mother   . Thyroid disease Brother   . Healthy Daughter    Past Surgical History:  Procedure Laterality Date  . LAPAROSCOPIC APPENDECTOMY N/A 04/11/2016   Procedure: APPENDECTOMY LAPAROSCOPIC;  Surgeon: Ovidio Kin, MD;  Location: [facility];  Service: General;  Laterality: N/A;   Social History   Social History Narrative   Does Gutter Work. Climbs Ladders.   Alcohol: 3-5 Beers each week day.                Friday Nights: 12 pack                Saturday: 12 pack                Sundays: None   Says that in past drank more. And also drank liquor in past but rarely drinks any liquor now.      Never smoked.    Single.    Has Girlfriend.   Has lost 25 lbs in past year secondary to diet changes-stopped eating late night.(entered 07/2013)   Was lifting weights until past 2 months-stopped sec to pain.   Immunization History  Administered Date(s) Administered  . Tdap 01/31/2016  Objective: Vital Signs: BP (!) 145/89 (BP Location: Left Arm, Patient Position: Sitting, Cuff Size: Normal)   Pulse 74   Resp 16   Ht 5\' 8"  (1.727 m)   Wt 246 lb (111.6 kg)   BMI 37.40 kg/m    Physical Exam Vitals and nursing note reviewed.  Constitutional:      Appearance: He is well-developed.  HENT:     Head: Normocephalic and atraumatic.  Eyes:     Conjunctiva/sclera: Conjunctivae normal.     Pupils: Pupils are equal, round, and reactive to light.  Cardiovascular:     Rate and Rhythm: Normal rate and regular rhythm.     Heart sounds: Normal heart sounds.  Pulmonary:     Effort: Pulmonary effort is normal.     Breath sounds: Normal breath sounds.   Abdominal:     General: Bowel sounds are normal.     Palpations: Abdomen is soft.  Musculoskeletal:     Cervical back: Normal range of motion and neck supple.  Skin:    General: Skin is warm and dry.     Capillary Refill: Capillary refill takes less than 2 seconds.  Neurological:     Mental Status: He is alert and oriented to person, place, and time.  Psychiatric:        Behavior: Behavior normal.      Musculoskeletal Exam: C-spine thoracic and lumbar spine with good range of motion.  Shoulder joints, elbow joints, wrist joints with good range of motion.  Tenderness on palpation of her right wrist joint but no synovitis was noted.  MCPs, PIPs and DIPs with good range of motion with no synovitis.  Hip joints, knee joints, ankles, MTPs and PIPs with good range of motion with no synovitis.  CDAI Exam: CDAI Score: 1.2  Patient Global: 1 mm; Provider Global: 1 mm Swollen: 0 ; Tender: 1  Joint Exam 07/14/2020      Right  Left  Wrist   Tender      There is currently no information documented on the homunculus. Go to the Rheumatology activity and complete the homunculus joint exam.  Investigation: No additional findings.  Imaging: No results found.  Recent Labs: Lab Results  Component Value Date   WBC 5.7 07/12/2020   HGB 15.6 07/12/2020   PLT 204 07/12/2020   NA 139 07/12/2020   K 4.3 07/12/2020   CL 104 07/12/2020   CO2 29 07/12/2020   GLUCOSE 100 07/12/2020   BUN 16 07/12/2020   CREATININE 1.00 07/12/2020   BILITOT 0.5 07/12/2020   ALKPHOS 66 04/10/2016   AST 38 (H) 07/12/2020   ALT 110 (H) 07/12/2020   PROT 7.1 07/12/2020   ALBUMIN 4.5 04/10/2016   CALCIUM 9.4 07/12/2020   GFRAA 101 07/12/2020   QFTBGOLDPLUS NEGATIVE 07/12/2020    Speciality Comments: No specialty comments available.  Procedures:  No procedures performed Allergies: Patient has no known allergies.   Assessment / Plan:     Visit Diagnoses: Rheumatoid arthritis with rheumatoid factor of  multiple sites without organ or systems involvement (HCC) - RF+, CCP+:  -He states he has been having a mild flare as he ran out of Enbrel about couple of weeks ago.  He had mild tenderness over his right wrist joint.  All the joints were in full range of motion with no synovitis.  Plan: XR Hand 2 View Right, XR Hand 2 View Left, XR Foot 2 Views Right, XR Foot 2 Views Left.  X-rays of bilateral hands  and feet did not show any radiographic progression.  High risk medication use - Enbrel 50 mg sq injections once weekly. D/c MTX due to elevated LFTs.  His labs are stable except for elevated LFTs.  TB gold was negative in September 2021.  Other fatigue-relates to his work.  Alcohol abuse-he states he consumes 3-4 beers per day.  Abstinence from alcohol was emphasized.  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  His BMI is elevated and fatty liver could be contributing to elevated LFTs as well.  Plan: AST, ALT in 1 month.  He was also referred to gastroenterology for evaluation.  BMI 37.0-37.9, adult-weight loss diet and exercise was discussed at length.  Increased risk of heart disease in association with rheumatoid arthritis was discussed.  Instructions were placed in the AVS.  Educated about COVID-19 virus infection-patient has not received COVID-19 vaccination.  Risk of not getting vaccination was discussed at length.  Use of mask, social distancing and hand hygiene was discussed.  I also advised in case he develops COVID-19 infection he will be a candidate for monoclonal antibody infusion.     Orders: Orders Placed This Encounter  Procedures  . XR Hand 2 View Right  . XR Hand 2 View Left  . XR Foot 2 Views Right  . XR Foot 2 Views Left  . AST  . ALT   No orders of the defined types were placed in this encounter.   Follow-Up Instructions: Return in about 5 months (around 12/14/2020) for Rheumatoid arthritis.   Pollyann Savoy,  MD  Note - This record has been created using Animal nutritionist.  Chart creation errors have been sought, but may not always  have been located. Such creation errors do not reflect on  the standard of medical care.

## 2020-07-13 NOTE — Progress Notes (Signed)
ALT is elevated and has been increasing.  Please asked patient if he consumes alcohol or takes any NSAIDs.  If not then he will need GI evaluation.

## 2020-07-14 ENCOUNTER — Encounter (INDEPENDENT_AMBULATORY_CARE_PROVIDER_SITE_OTHER): Payer: Self-pay

## 2020-07-14 ENCOUNTER — Ambulatory Visit: Payer: Self-pay

## 2020-07-14 ENCOUNTER — Encounter: Payer: Self-pay | Admitting: Rheumatology

## 2020-07-14 ENCOUNTER — Other Ambulatory Visit: Payer: Self-pay

## 2020-07-14 ENCOUNTER — Ambulatory Visit: Payer: Self-pay | Admitting: Rheumatology

## 2020-07-14 VITALS — BP 145/89 | HR 74 | Resp 16 | Ht 68.0 in | Wt 246.0 lb

## 2020-07-14 DIAGNOSIS — Z7189 Other specified counseling: Secondary | ICD-10-CM

## 2020-07-14 DIAGNOSIS — Z6837 Body mass index (BMI) 37.0-37.9, adult: Secondary | ICD-10-CM

## 2020-07-14 DIAGNOSIS — M0579 Rheumatoid arthritis with rheumatoid factor of multiple sites without organ or systems involvement: Secondary | ICD-10-CM

## 2020-07-14 DIAGNOSIS — F1010 Alcohol abuse, uncomplicated: Secondary | ICD-10-CM

## 2020-07-14 DIAGNOSIS — A6002 Herpesviral infection of other male genital organs: Secondary | ICD-10-CM

## 2020-07-14 DIAGNOSIS — R7989 Other specified abnormal findings of blood chemistry: Secondary | ICD-10-CM

## 2020-07-14 DIAGNOSIS — R5383 Other fatigue: Secondary | ICD-10-CM

## 2020-07-14 DIAGNOSIS — Z79899 Other long term (current) drug therapy: Secondary | ICD-10-CM

## 2020-07-14 LAB — CBC WITH DIFFERENTIAL/PLATELET
Absolute Monocytes: 462 cells/uL (ref 200–950)
Basophils Absolute: 63 cells/uL (ref 0–200)
Basophils Relative: 1.1 %
Eosinophils Absolute: 291 cells/uL (ref 15–500)
Eosinophils Relative: 5.1 %
HCT: 47.8 % (ref 38.5–50.0)
Hemoglobin: 15.6 g/dL (ref 13.2–17.1)
Lymphs Abs: 2012 cells/uL (ref 850–3900)
MCH: 28.5 pg (ref 27.0–33.0)
MCHC: 32.6 g/dL (ref 32.0–36.0)
MCV: 87.2 fL (ref 80.0–100.0)
MPV: 11.2 fL (ref 7.5–12.5)
Monocytes Relative: 8.1 %
Neutro Abs: 2873 cells/uL (ref 1500–7800)
Neutrophils Relative %: 50.4 %
Platelets: 204 10*3/uL (ref 140–400)
RBC: 5.48 10*6/uL (ref 4.20–5.80)
RDW: 12.7 % (ref 11.0–15.0)
Total Lymphocyte: 35.3 %
WBC: 5.7 10*3/uL (ref 3.8–10.8)

## 2020-07-14 LAB — QUANTIFERON-TB GOLD PLUS
Mitogen-NIL: >10 IU/mL
NIL: 0.02 IU/mL
QuantiFERON-TB Gold Plus: NEGATIVE
TB1-NIL: 0 IU/mL
TB2-NIL: 0 IU/mL

## 2020-07-14 LAB — COMPLETE METABOLIC PANEL WITH GFR
AG Ratio: 1.8 (calc) (ref 1.0–2.5)
ALT: 110 U/L — ABNORMAL HIGH (ref 9–46)
AST: 38 U/L — ABNORMAL HIGH (ref 10–35)
Albumin: 4.6 g/dL (ref 3.6–5.1)
Alkaline phosphatase (APISO): 56 U/L (ref 35–144)
BUN: 16 mg/dL (ref 7–25)
CO2: 29 mmol/L (ref 20–32)
Calcium: 9.4 mg/dL (ref 8.6–10.3)
Chloride: 104 mmol/L (ref 98–110)
Creat: 1 mg/dL (ref 0.70–1.33)
GFR, Est African American: 101 mL/min/{1.73_m2} (ref >=60)
GFR, Est Non African American: 87 mL/min/{1.73_m2} (ref >=60)
Globulin: 2.5 g/dL (calc) (ref 1.9–3.7)
Glucose, Bld: 100 mg/dL (ref 65–139)
Potassium: 4.3 mmol/L (ref 3.5–5.3)
Sodium: 139 mmol/L (ref 135–146)
Total Bilirubin: 0.5 mg/dL (ref 0.2–1.2)
Total Protein: 7.1 g/dL (ref 6.1–8.1)

## 2020-07-14 MED ORDER — ENBREL MINI 50 MG/ML ~~LOC~~ SOCT
50.0000 mg | SUBCUTANEOUS | 0 refills | Status: DC
Start: 2020-07-14 — End: 2021-06-15

## 2020-07-14 NOTE — Progress Notes (Signed)
TB gold negative

## 2020-07-14 NOTE — Patient Instructions (Addendum)
COVID-19 vaccine recommendations:   COVID-19 vaccine is recommended for everyone (unless you are allergic to a vaccine component), even if you are on a medication that suppresses your immune system.   If you are on Methotrexate, Cellcept (mycophenolate), Rinvoq, Harriette Ohara, and Olumiant- hold the medication for 1 week after each vaccine. Hold Methotrexate for 2 weeks after the single dose COVID-19 vaccine.   If you are on Orencia subcutaneous injection - hold medication one week prior to and one week after the first COVID-19 vaccine dose (only).   If you are on Orencia IV infusions- time vaccination administration so that the first COVID-19 vaccination will occur four weeks after the infusion and postpone the subsequent infusion by one week.   If you are on Cyclophosphamide or Rituxan infusions please contact your doctor prior to receiving the COVID-19 vaccine.   Do not take Tylenol or any anti-inflammatory medications (NSAIDs) 24 hours prior to the COVID-19 vaccination.   There is no direct evidence about the efficacy of the COVID-19 vaccine in individuals who are on medications that suppress the immune system.   Even if you are fully vaccinated, and you are on any medications that suppress your immune system, please continue to wear a mask, maintain at least six feet social distance and practice hand hygiene.   If you develop a COVID-19 infection, please contact your PCP or our office to determine if you need antibody infusion.  The booster vaccine is now available for immunocompromised patients. It is advised that if you had Pfizer vaccine you should get [employer] booster.  If you had a Moderna vaccine then you should get a Moderna booster. Johnson and Laural Benes does not have a booster vaccine at this time.  Please see the following web sites for updated information.    https://[email or web address].org/[email or web address]/0/Files/COVID-19-Vaccination-Patient-Resources.pdf  [email or web address]://[email or web address].org/About-Us/Newsroom/Press-Releases/ID/1159   Return in 12month for repeat liver function test.  Standing Labs We placed an order today for your standing lab work.   Please have your standing labs drawn in December and every 3 months  If possible, please have your labs drawn 2 weeks prior to your appointment so that the provider can discuss your results at your appointment.  We have open lab daily Monday through Thursday from 8:30-12:30 PM and 1:30-4:30 PM and Friday from 8:30-12:30 PM and 1:30-4:00 PM at the office of Dr. Pollyann Savoy, [facility].   Please be advised, patients with office appointments requiring lab work will take precedents over walk-in lab work.  If possible, please come for your lab work on Monday and Friday afternoons, as you may experience shorter wait times. The office is located at [address], [place], [place] [address] No appointment is necessary.   Labs are drawn by [facility]. Please bring your co-pay at the time of your lab draw.  You may receive a bill from [facility] for your lab work.  If you wish to have your labs drawn at another location, please call the office 24 hours in advance to send orders.  If you have any questions regarding directions or hours of operation,  please call [phone].   As a reminder, please drink plenty of water prior to coming for your lab work. Thanks  Heart Disease Prevention   Your inflammatory disease increases your risk of heart disease which includes heart attack, stroke, atrial fibrillation (irregular heartbeats), high blood pressure, heart failure and atherosclerosis (plaque in the arteries).  It is important to reduce your risk by:   Keep blood pressure, cholesterol, and  blood sugar at healthy levels   Smoking Cessation   Maintain a healthy weight   BMI 20-25   Eat a  healthy diet   Plenty of fresh fruit, vegetables, and whole grains   Limit saturated fats, foods high in sodium, and added sugars   DASH and Mediterranean diet   Increase physical activity   Recommend moderate physically activity for 150 minutes per week/ 30 minutes a day for five days a week These can be broken up into three separate ten-minute sessions during the day.   Reduce Stress   Meditation, slow breathing exercises, yoga, coloring books   Dental visits twice a year

## 2020-07-14 NOTE — Progress Notes (Signed)
Medication Samples have been provided to the patient.  Drug name: enbrel mini   Strength: 50mg    Qty: 1 LOT  Exp.Date: 4/23 Dosing instructions: Inject 50mg  into the skin once weekly.   The patient has been instructed regarding the correct time, dose, and frequency of taking this medication, including desired effects and most common side effects.   5/23 8:17 AM 07/14/2020

## 2020-07-27 ENCOUNTER — Encounter: Payer: Self-pay | Admitting: Gastroenterology

## 2020-08-27 ENCOUNTER — Other Ambulatory Visit (INDEPENDENT_AMBULATORY_CARE_PROVIDER_SITE_OTHER): Payer: Self-pay

## 2020-08-27 ENCOUNTER — Ambulatory Visit: Payer: Self-pay | Admitting: Gastroenterology

## 2020-08-27 ENCOUNTER — Encounter: Payer: Self-pay | Admitting: Gastroenterology

## 2020-08-27 VITALS — BP 130/80 | HR 63 | Ht 68.0 in | Wt 242.0 lb

## 2020-08-27 DIAGNOSIS — R7401 Elevation of levels of liver transaminase levels: Secondary | ICD-10-CM

## 2020-08-27 LAB — HEPATIC FUNCTION PANEL
ALT: 120 U/L — ABNORMAL HIGH (ref 0–53)
AST: 39 U/L — ABNORMAL HIGH (ref 0–37)
Albumin: 4.8 g/dL (ref 3.5–5.2)
Alkaline Phosphatase: 58 U/L (ref 39–117)
Bilirubin, Direct: 0.1 mg/dL (ref 0.0–0.3)
Total Bilirubin: 0.4 mg/dL (ref 0.2–1.2)
Total Protein: 7.5 g/dL (ref 6.0–8.3)

## 2020-08-27 NOTE — Progress Notes (Signed)
[facility] Consult Note:  History: John Blackwell 08/27/2020  Referring provider: Orlena Sheldon, PA-C  Reason for consult/chief complaint: Elevated Hepatic Enzymes (Elevated LFTs )   Subjective  HPI:  This is a very pleasant 49 year old man referred by rheumatology for elevated LFTs.  He was diagnosed with RA in 2015, was on methotrexate for some time until it was stopped due to elevated LFTs.  He has been on Enbrel for least the last couple of years.  He uncommonly uses NSAIDs for a flare of pain and stiffness in his hands.  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  His rheumatologist recommended that he cut down, so he has lately been trying to make some changes. He has no family history of liver disease.  ROS:  Review of Systems  Constitutional: Negative for appetite change and unexpected weight change.  HENT: Negative for mouth sores and voice change.   Eyes: Negative for pain and redness.  Respiratory: Negative for cough and shortness of breath.   Cardiovascular: Negative for chest pain and palpitations.  Genitourinary: Negative for dysuria and hematuria.  Musculoskeletal: Positive for arthralgias. Negative for myalgias.  Skin: Negative for pallor and rash.  Neurological: Negative for weakness and headaches.  Hematological: Negative for adenopathy.     Past Medical History: Past Medical History:  Diagnosis Date  . Alcohol abuse   . Arthritis 08/06/2013   OA of hands,feet, knees  . 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 RA (rheumatoid arthritis) ([place])      Past Surgical History: Past Surgical History:  Procedure Laterality Date  . LAPAROSCOPIC APPENDECTOMY N/A 04/11/2016   Procedure: APPENDECTOMY LAPAROSCOPIC;  Surgeon: John Overall, MD;  Location: [facility];  Service: General;  Laterality: N/A;     Family History: Family History  Problem Relation Age of Onset  .  Fibromyalgia Mother   . Thyroid disease Brother   . Healthy Daughter     Social History: Social History   Socioeconomic History  . Marital status: Single    Spouse name: Not on file  . Number of children: Not on file  . Years of education: Not on file  . Highest education level: Not on file  Occupational History  . Occupation: Gutter Work  Tobacco Use  . Smoking status: Never Smoker  . Smokeless tobacco: Never Used  Vaping Use  . Vaping Use: Never used  Substance and Sexual Activity  . Alcohol use: Yes    Alcohol/week: 28.0 standard drinks    Types: 28 Cans of beer per week  . Drug use: Never  . Sexual activity: Yes  Other Topics Concern  . 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   Alcohol: 3-5 Beers each week day.                Friday Nights: 12 pack                Saturday: 12 pack                Sundays: None   Says that in past drank more. And also drank liquor in past but rarely drinks any liquor now.      Never smoked.    Single.    Has Girlfriend.   Has lost 25 lbs in past year secondary to diet changes-stopped eating late night.(entered 07/2013)   Was  lifting weights until past 2 months-stopped sec to pain.   Social Determinants of Health   Financial Resource Strain:   . Difficulty of Paying Living Expenses: Not on file  Food Insecurity:   . Worried About Charity fundraiser in the Last Year: Not on file  . Ran Out of Food in the Last Year: Not on file  Transportation Needs:   . Lack of Transportation (Medical): Not on file  . Lack of Transportation (Non-Medical): Not on file  Physical Activity:   . Days of Exercise per Week: Not on file  . Minutes of Exercise per Session: Not on file  Stress:   . Feeling of Stress : Not on file  Social Connections:   . Frequency of Communication with Friends and Family: Not on file  . Frequency of Social Gatherings with Friends and Family: Not on file  . Attends Religious  Services: Not on file  . Active Member of Clubs or Organizations: Not on file  . Attends Archivist Meetings: Not on file  . Marital Status: Not on file    Allergies: No Known Allergies  Outpatient Meds: Current Outpatient Medications  Medication Sig Dispense Refill  . Ascorbic Acid (VITAMIN C) 1000 MG tablet Take 1,000 mg by mouth 2 (two) times a week.    . B Complex-C (SUPER B COMPLEX PO) Take by mouth 2 (two) times a week.    . Cholecalciferol (VITAMIN D3) 5000 units CAPS Take by mouth 2 (two) times a week.    . Etanercept (ENBREL MINI) 50 MG/ML SOCT Inject 50 mg into the skin once a week. 12 mL 0  . pyridOXINE (VITAMIN B-6) 100 MG tablet Take 100 mg by mouth 2 (two) times a week.    . thiamine (VITAMIN B-1) 100 MG tablet Take 100 mg by mouth 2 (two) times a week.    . valACYclovir (VALTREX) 500 MG tablet Take 1 tablet (500 mg total) by mouth daily. 90 tablet 3  . vitamin A 10000 UNIT capsule Take 10,000 Units by mouth 2 (two) times a week.    . vitamin B-12 (CYANOCOBALAMIN) 1000 MCG tablet Take 1,000 mcg by mouth 2 (two) times a week.    . vitamin E 1000 UNIT capsule Take 1,000 Units by mouth 2 (two) times a week.     No current facility-administered medications for this visit.      ___________________________________________________________________ Objective   Exam:  BP 130/80   Pulse 63   Ht '5\' 8"'  (1.727 m)   Wt 242 lb (109.8 kg)   BMI 36.80 kg/m    General: Well-appearing  Eyes: sclera anicteric, no redness  ENT: oral mucosa moist without lesions, no cervical or supraclavicular lymphadenopathy  CV: RRR without murmur, S1/S2, no JVD, no peripheral edema  Resp: clear to auscultation bilaterally, normal RR and effort noted  GI: soft, no tenderness, with active bowel sounds. No guarding or palpable organomegaly noted.  Skin; warm and dry, no rash or jaundice noted  Neuro: awake, alert and oriented x 3. Normal gross motor function and fluent  speech  Labs:  CBC Latest Ref Rng & Units 07/12/2020 03/24/2020 10/08/2019  WBC 3.8 - 10.8 Thousand/uL 5.7 5.9 5.2  Hemoglobin 13.2 - 17.1 g/dL 15.6 15.5 15.1  Hematocrit 38 - 50 % 47.8 47.1 45.8  Platelets 140 - 400 Thousand/uL 204 187 197   CMP Latest Ref Rng & Units 08/27/2020 07/12/2020 03/24/2020  Glucose 65 - 139 mg/dL - 100 92  BUN  7 - 25 mg/dL - 16 16  Creatinine 0.70 - 1.33 mg/dL - 1.00 1.06  Sodium 135 - 146 mmol/L - 139 137  Potassium 3.5 - 5.3 mmol/L - 4.3 4.3  Chloride 98 - 110 mmol/L - 104 103  CO2 20 - 32 mmol/L - 29 27  Calcium 8.6 - 10.3 mg/dL - 9.4 9.6  Total Protein 6.0 - 8.3 g/dL 7.5 7.1 7.0  Total Bilirubin 0.2 - 1.2 mg/dL 0.4 0.5 0.7  Alkaline Phos 39 - 117 U/L 58 - -  AST 0 - 37 U/L 39(H) 38(H) 34  ALT 0 - 53 U/L 120(H) 110(H) 76(H)  Alk Phos persistently normal  Last nml LFTs 2017, then mild ALT-predominant elevation April 2019, fluctuating in this range since.  Hgb A1c nml 2018  Neg Acute Hep panel April 2019   Radiologic Studies:  Last abdominal imaging in 2017 during episode of appendicitis.  Liver normal at that time.  Assessment: Encounter Diagnosis  Name Primary?  . Transaminitis Yes    ALT predominant transaminitis fluctuating but persistent over at least the last 2 years.  Probably alcohol-related, doubt related to his RA medicine, and he uncommonly uses NSAIDs. Possible autoimmune liver disease but considered less likely.  Plan:  Decrease alcohol consumption.  Frankly, complete abstinence for period of time would be best to see if LFTs improved with that. Repeat hepatic function panel today, autoimmune liver labs Right upper quadrant ultrasound  I also recommended screening colonoscopy, but he did not feel ready to schedule that, partially due to his work schedule but also due to lack of insurance.  Thank you for the courtesy of this consult.  Please call me with any questions or concerns.  Nelida Meuse III  CC: Referring provider  noted above

## 2020-08-27 NOTE — Patient Instructions (Addendum)
If you are age 50 or older, your body mass index should be between 23-30. Your Body mass index is 36.8 kg/m. If this is out of the aforementioned range listed, please consider follow up with your Primary Care Provider.  If you are age 45 or younger, your body mass index should be between 19-25. Your Body mass index is 36.8 kg/m. If this is out of the aformentioned range listed, please consider follow up with your Primary Care Provider.   It has been recommended to you by your physician that you have a(n) Colonoscopy completed. Per your request, we did not schedule the procedure(s) today. Please contact our office at [phone] should you decide to have the procedure completed. You will be scheduled for a pre-visit and procedure at that time.  Your provider has requested that you go to the basement level for lab work before leaving today. Press "B" on the elevator. The lab is located at the first door on the left as you exit the elevator.  You have been scheduled for an abdominal ultrasound at [facility] (1st floor of hospital) on 09-03-2020 at 8am. Please arrive 15 minutes prior to your appointment for registration. Make certain not to have anything to eat or drink 6 hours prior to your appointment. Should you need to reschedule your appointment, please contact radiology at [phone]. This test typically takes about 30 minutes to perform.  Due to recent changes in healthcare laws, you may see the results of your imaging and laboratory studies on MyChart before your provider has had a chance to review them.  We understand that in some cases there may be results that are confusing or concerning to you. Not all laboratory results come back in the same time frame and the provider may be waiting for multiple results in order to interpret others.  Please give [place] 48 hours in order for your provider to thoroughly review all the results before contacting the office for clarification of your  results.   It was a pleasure to see you today!  Dr. Myrtie Neither

## 2020-08-31 ENCOUNTER — Ambulatory Visit: Payer: Self-pay | Admitting: Physician Assistant

## 2020-09-01 LAB — MITOCHONDRIAL ANTIBODIES: Mitochondrial M2 Ab, IgG: <=20 U

## 2020-09-01 LAB — ALPHA-1-ANTITRYPSIN: A-1 Antitrypsin, Ser: 118 mg/dL (ref 83–199)

## 2020-09-01 LAB — ANTI-SMOOTH MUSCLE ANTIBODY, IGG: Actin (Smooth Muscle) Antibody (IGG): <20 U (ref <20)

## 2020-09-03 ENCOUNTER — Other Ambulatory Visit: Payer: Self-pay

## 2020-09-03 ENCOUNTER — Ambulatory Visit (HOSPITAL_COMMUNITY)
Admission: RE | Admit: 2020-09-03 | Discharge: 2020-09-03 | Disposition: A | Discharge status: Home / Self Care | Payer: Self-pay | Source: Ambulatory Visit | Attending: Gastroenterology | Admitting: Gastroenterology

## 2020-09-03 DIAGNOSIS — R7401 Elevation of levels of liver transaminase levels: Secondary | ICD-10-CM | POA: Insufficient documentation

## 2020-11-08 ENCOUNTER — Telehealth: Payer: Self-pay

## 2020-11-08 DIAGNOSIS — Z79899 Other long term (current) drug therapy: Secondary | ICD-10-CM

## 2020-11-08 NOTE — Telephone Encounter (Signed)
Patient called requesting labwork orders be sent to [facility] on [address].

## 2020-11-08 NOTE — Telephone Encounter (Signed)
Lab Orders released.  

## 2020-12-02 NOTE — Progress Notes (Deleted)
Office Visit Note  Patient: John Blackwell             Date of Birth: 12-07-1970           MRN: [id number]             PCP: Patient, No Pcp Per Referring: Dorena Bodo, PA-C Visit Date: 12/16/2020 Occupation: @GUAROCC @  Subjective:  No chief complaint on file.   History of Present Illness: John Blackwell is a 50 y.o. male ***   Activities of Daily Living:  Patient reports morning stiffness for *** {minute/hour:19697}.   Patient {ACTIONS;DENIES/REPORTS:21021675::"Denies"} nocturnal pain.  Difficulty dressing/grooming: {ACTIONS;DENIES/REPORTS:21021675::"Denies"} Difficulty climbing stairs: {ACTIONS;DENIES/REPORTS:21021675::"Denies"} Difficulty getting out of chair: {ACTIONS;DENIES/REPORTS:21021675::"Denies"} Difficulty using hands for taps, buttons, cutlery, and/or writing: {ACTIONS;DENIES/REPORTS:21021675::"Denies"}  No Rheumatology ROS completed.   PMFS History:  Patient Active Problem List   Diagnosis Date Noted  . Ruptured suppurative appendicitis 04/13/2016  . Right flank mass 10/14/2014  . RA (rheumatoid arthritis) ([facility]) 08/27/2014  . Rheumatoid factor positive 12/19/2013  . Arthralgia of multiple sites 11/18/2013  . Arthritis 08/06/2013  . 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 Alcohol abuse     Past Medical History:  Diagnosis Date  . Alcohol abuse   . Arthritis 08/06/2013   OA of hands,feet, knees  . 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 RA (rheumatoid arthritis) ([facility])     Family History  Problem Relation Age of Onset  . Fibromyalgia Mother   . Thyroid disease Brother   . Healthy Daughter    Past Surgical History:  Procedure Laterality Date  . 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   Alcohol: 3-5 Beers each week day.                Friday  Nights: 12 pack                Saturday: 12 pack                Sundays: None   Says that in past drank more. And also drank liquor in past but rarely drinks any liquor now.      Never smoked.    Single.    Has Girlfriend.   Has lost 25 lbs in past year secondary to diet changes-stopped eating late night.(entered 07/2013)   Was lifting weights until past 2 months-stopped sec to pain.   Immunization History  Administered Date(s) Administered  . Tdap 01/31/2016     Objective: Vital Signs: There were no vitals taken for this visit.   Physical Exam   Musculoskeletal Exam: ***  CDAI Exam: CDAI Score: -- Patient Global: --; Provider Global: -- Swollen: --; Tender: -- Joint Exam 12/16/2020   No joint exam has been documented for this visit   There is currently no information documented on the homunculus. Go to the Rheumatology activity and complete the homunculus joint exam.  Investigation: No additional findings.  Imaging: No results found.  Recent Labs: Lab Results  Component Value Date   WBC 5.7 07/12/2020   HGB 15.6 07/12/2020   PLT 204 07/12/2020   NA 139 07/12/2020   K 4.3 07/12/2020   CL 104 07/12/2020   CO2 29 07/12/2020   GLUCOSE 100 07/12/2020   BUN 16 07/12/2020   CREATININE 1.00  07/12/2020   BILITOT 0.4 08/27/2020   ALKPHOS 58 08/27/2020   AST 39 (H) 08/27/2020   ALT 120 (H) 08/27/2020   PROT 7.5 08/27/2020   ALBUMIN 4.8 08/27/2020   CALCIUM 9.4 07/12/2020   GFRAA 101 07/12/2020   QFTBGOLDPLUS NEGATIVE 07/12/2020    Speciality Comments: No specialty comments available.  Procedures:  No procedures performed Allergies: Patient has no known allergies.   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  No orders of the defined types were placed in this encounter.   Face-to-face time spent with patient was *** minutes. Greater than 50% of time was spent in counseling and coordination of care.  Follow-Up Instructions: No follow-ups on file.   Gearldine Bienenstock, PA-C  Note - This record has been created using Dragon software.  Chart creation errors have been sought, but may not always  have been located. Such creation errors do not reflect on  the standard of medical care.

## 2020-12-13 NOTE — Progress Notes (Unsigned)
Office Visit Note  Patient: John Blackwell             Date of Birth: 02-Apr-1971           MRN: [id number]             PCP: Patient, No Pcp Per Referring: Dorena Bodo, PA-C Visit Date: 12/23/2020 Occupation: @GUAROCC @  Subjective:  Medication monitoring   History of Present Illness: John Blackwell is a 50 y.o. male with history of seropositive rheumatoid arthritis.  He is on enbrel 50 mg sq injections once weekly.  He has not had any recent rheumatoid arthritis flares.  He is not experiencing any joint pain or joint swelling at this time.  He experiences occasional arthralgias in the afternoon which she attributes to a physically demanding his job is.  He denies any neck or lower back pain currently. He states he recently had a sinus infection and hold Enbrel for 1 week while his symptoms improved.  He denies missing any other doses of Enbrel recently.   Activities of Daily Living:  Patient reports morning stiffness for 5 minutes.   Patient Reports nocturnal pain.  Difficulty dressing/grooming: Denies Difficulty climbing stairs: Denies Difficulty getting out of chair: Denies Difficulty using hands for taps, buttons, cutlery, and/or writing: Denies  Review of Systems  Constitutional: Positive for fatigue.  HENT: Negative for mouth sores, mouth dryness and nose dryness.   Eyes: Negative for pain, itching and dryness.  Respiratory: Negative for shortness of breath and difficulty breathing.   Cardiovascular: Negative for chest pain and palpitations.  Gastrointestinal: Negative for blood in stool, constipation and diarrhea.  Endocrine: Negative for increased urination.  Genitourinary: Negative for difficulty urinating.  Musculoskeletal: Positive for arthralgias, joint pain and morning stiffness. Negative for joint swelling, myalgias, muscle tenderness and myalgias.  Skin: Negative for color change, rash and redness.  Allergic/Immunologic: Negative for susceptible to  infections.  Neurological: Negative for dizziness, numbness, headaches, memory loss and weakness.  Hematological: Negative for bruising/bleeding tendency.  Psychiatric/Behavioral: Negative for confusion.    PMFS History:  Patient Active Problem List   Diagnosis Date Noted  . Ruptured suppurative appendicitis 04/13/2016  . Right flank mass 10/14/2014  . RA (rheumatoid arthritis) (HCC) 08/27/2014  . Rheumatoid factor positive 12/19/2013  . Arthralgia of multiple sites 11/18/2013  . Arthritis 08/06/2013  . 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 Alcohol abuse     Past Medical History:  Diagnosis Date  . Alcohol abuse   . Arthritis 08/06/2013   OA of hands,feet, knees  . 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 RA (rheumatoid arthritis) (HCC)     Family History  Problem Relation Age of Onset  . Fibromyalgia Mother   . Thyroid disease Brother   . Healthy Daughter    Past Surgical History:  Procedure Laterality Date  . 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   Alcohol: 3-5 Beers each week day.                Friday Nights: 12 pack                Saturday: 12 pack                Sundays: None   Says that in  past drank more. And also drank liquor in past but rarely drinks any liquor now.      Never smoked.    Single.    Has Girlfriend.   Has lost 25 lbs in past year secondary to diet changes-stopped eating late night.(entered 07/2013)   Was lifting weights until past 2 months-stopped sec to pain.   Immunization History  Administered Date(s) Administered  . Tdap 01/31/2016     Objective: Vital Signs: BP (!) 149/93 (BP Location: Left Arm, Patient Position: Sitting, Cuff Size: Normal)   Pulse 70   Resp 16   Ht 5\' 8"  (1.727 m)   Wt 250 lb 6.4 oz (113.6 kg)   BMI 38.07  kg/m    Physical Exam Vitals and nursing note reviewed.  Constitutional:      Appearance: He is well-developed and well-nourished.  HENT:     Head: Normocephalic and atraumatic.  Eyes:     Extraocular Movements: EOM normal.     Conjunctiva/sclera: Conjunctivae normal.     Pupils: Pupils are equal, round, and reactive to light.  Pulmonary:     Effort: Pulmonary effort is normal.  Abdominal:     Palpations: Abdomen is soft.  Musculoskeletal:     Cervical back: Normal range of motion and neck supple.  Skin:    General: Skin is warm and dry.     Capillary Refill: Capillary refill takes less than 2 seconds.  Neurological:     Mental Status: He is alert and oriented to person, place, and time.  Psychiatric:        Mood and Affect: Mood and affect normal.        Behavior: Behavior normal.      Musculoskeletal Exam: C-spine, thoracic spine, and lumbar spine good ROM.  No midline spinal tenderness.  No SI joint tenderness.  Shoulder joints, elbow joints, wrist joints, MCPs, PIPs, and DIPs good ROM with no synovitis. DIP prominence consistent with OA of both hands.  Hip joints, knee joints, and ankle joints good ROM with no discomfort.  No warmth or effusion of knee joints.  No tenderness or swelling of ankle joints.   CDAI Exam: CDAI Score: 0.2  Patient Global: 1 mm; Provider Global: 1 mm Swollen: 0 ; Tender: 0  Joint Exam 12/23/2020   No joint exam has been documented for this visit   There is currently no information documented on the homunculus. Go to the Rheumatology activity and complete the homunculus joint exam.  Investigation: No additional findings.  Imaging: No results found.  Recent Labs: Lab Results  Component Value Date   WBC 6.9 12/22/2020   HGB 16.7 12/22/2020   PLT 263 12/22/2020   NA 141 12/22/2020   K 4.3 12/22/2020   CL 105 12/22/2020   CO2 27 12/22/2020   GLUCOSE 93 12/22/2020   BUN 21 12/22/2020   CREATININE 0.95 12/22/2020   BILITOT 0.5  12/22/2020   ALKPHOS 58 08/27/2020   AST 28 12/22/2020   ALT 77 (H) 12/22/2020   PROT 7.2 12/22/2020   ALBUMIN 4.8 08/27/2020   CALCIUM 10.3 12/22/2020   GFRAA 108 12/22/2020   QFTBGOLDPLUS NEGATIVE 07/12/2020    Speciality Comments: No specialty comments available.  Procedures:  No procedures performed Allergies: Patient has no known allergies.   Assessment / Plan:     Visit Diagnoses: Rheumatoid arthritis with rheumatoid factor of multiple sites without organ or systems involvement (HCC) - RF+, CCP+: He has no joint tenderness or synovitis on exam.  He has not had any recent rheumatoid arthritis flares.  He is clinically been doing well on Enbrel 50 mg subcutaneous injections once weekly.  His morning stiffness has been lasting for about 5 minutes daily.  He has occasional arthralgias and joint stiffness in the afternoon after working his physically demanding job.  He has not had any nocturnal pain recently.  No difficulty with ADLs.  He will continue on Enbrel 50 mg subcutaneous injections once weekly.  He was advised to notify us if he develops increased joint pain or joint swelling.  He will follow-up in the office in 5 months.  High risk medication use - Enbrel 50 mg sq once weekly. D/c MTX due to elevated LFTs. CBC and CMP updated on 12/22/20 and were reviewed with the patient today in the office. His next lab work will be due in May and every 3 months. Standing orders for Cbc and CMP remain in place. TB gold negative on July 12, 2020 and will continue to be monitored yearly. He has not received the covid-19 vaccine and does not plan to at this time.  He was advised to hold Enbrel if he develops signs or symptoms of an infection and to resume once infection has completely cleared.  He voiced understanding.  Other fatigue: He is not experiencing any increased fatigue.   Elevated LFTs: He was evaluated by Dr. Myrtie Neither on 08/27/2020 for persistently elevated LFTs for the last 2 years.   At that visit Dr. Myrtie Neither discussed decreasing alcohol consumption.  Additional lab work was obtained at that visit: Alpha-1 antitrypsin within normal limits, my troponin mitochondrial antibodies negative, anti-smooth muscle antibody negative, AST 39, ALT 120, and rest of hepatic function panel WNL.  He underwent an ultrasound of the right upper quadrant on 09/03/2020 which revealed diffusely increased liver parenchymal echogenicity which is nonspecific and most likely seen with hepatic steatosis.  AST was 77 and ALT was within normal limits-28 on 12/22/2020.  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  No orders of the defined types were placed in this encounter.     Follow-Up Instructions: Return in about 5 months (around 05/22/2021) for Rheumatoid arthritis.   Gearldine Bienenstock, PA-C  Note - This record has been created using Dragon software.  Chart creation errors have been sought, but may not always  have been located. Such creation errors do not reflect on  the standard of medical care.

## 2020-12-16 ENCOUNTER — Ambulatory Visit: Payer: Self-pay | Admitting: Physician Assistant

## 2020-12-16 DIAGNOSIS — Z79899 Other long term (current) drug therapy: Secondary | ICD-10-CM

## 2020-12-16 DIAGNOSIS — R5383 Other fatigue: Secondary | ICD-10-CM

## 2020-12-16 DIAGNOSIS — R7989 Other specified abnormal findings of blood chemistry: Secondary | ICD-10-CM

## 2020-12-16 DIAGNOSIS — F1010 Alcohol abuse, uncomplicated: Secondary | ICD-10-CM

## 2020-12-16 DIAGNOSIS — A6002 Herpesviral infection of other male genital organs: Secondary | ICD-10-CM

## 2020-12-16 DIAGNOSIS — M0579 Rheumatoid arthritis with rheumatoid factor of multiple sites without organ or systems involvement: Secondary | ICD-10-CM

## 2020-12-16 DIAGNOSIS — Z6837 Body mass index (BMI) 37.0-37.9, adult: Secondary | ICD-10-CM

## 2020-12-20 ENCOUNTER — Other Ambulatory Visit: Payer: Self-pay | Admitting: *Deleted

## 2020-12-20 ENCOUNTER — Telehealth: Payer: Self-pay

## 2020-12-20 DIAGNOSIS — Z79899 Other long term (current) drug therapy: Secondary | ICD-10-CM

## 2020-12-20 NOTE — Telephone Encounter (Signed)
Lab Orders released.  

## 2020-12-20 NOTE — Telephone Encounter (Signed)
Patient called requesting his labwork orders be sent to [facility] on [address].  Patient states he will be going tomorrow 12/21/20.

## 2020-12-23 ENCOUNTER — Ambulatory Visit: Payer: Self-pay | Admitting: Physician Assistant

## 2020-12-23 ENCOUNTER — Other Ambulatory Visit: Payer: Self-pay

## 2020-12-23 ENCOUNTER — Encounter: Payer: Self-pay | Admitting: Physician Assistant

## 2020-12-23 VITALS — BP 149/93 | HR 70 | Resp 16 | Ht 68.0 in | Wt 250.4 lb

## 2020-12-23 DIAGNOSIS — M0579 Rheumatoid arthritis with rheumatoid factor of multiple sites without organ or systems involvement: Secondary | ICD-10-CM

## 2020-12-23 DIAGNOSIS — Z6837 Body mass index (BMI) 37.0-37.9, adult: Secondary | ICD-10-CM

## 2020-12-23 DIAGNOSIS — A6002 Herpesviral infection of other male genital organs: Secondary | ICD-10-CM

## 2020-12-23 DIAGNOSIS — F1010 Alcohol abuse, uncomplicated: Secondary | ICD-10-CM

## 2020-12-23 DIAGNOSIS — R5383 Other fatigue: Secondary | ICD-10-CM

## 2020-12-23 DIAGNOSIS — R7989 Other specified abnormal findings of blood chemistry: Secondary | ICD-10-CM

## 2020-12-23 DIAGNOSIS — Z79899 Other long term (current) drug therapy: Secondary | ICD-10-CM

## 2020-12-23 LAB — CBC WITH DIFFERENTIAL/PLATELET
Absolute Monocytes: 531 cells/uL (ref 200–950)
Basophils Absolute: 62 cells/uL (ref 0–200)
Basophils Relative: 0.9 %
Eosinophils Absolute: 269 cells/uL (ref 15–500)
Eosinophils Relative: 3.9 %
HCT: 49.4 % (ref 38.5–50.0)
Hemoglobin: 16.7 g/dL (ref 13.2–17.1)
Lymphs Abs: 2574 cells/uL (ref 850–3900)
MCH: 29.1 pg (ref 27.0–33.0)
MCHC: 33.8 g/dL (ref 32.0–36.0)
MCV: 86.2 fL (ref 80.0–100.0)
MPV: 11.6 fL (ref 7.5–12.5)
Monocytes Relative: 7.7 %
Neutro Abs: 3464 cells/uL (ref 1500–7800)
Neutrophils Relative %: 50.2 %
Platelets: 263 10*3/uL (ref 140–400)
RBC: 5.73 10*6/uL (ref 4.20–5.80)
RDW: 12.1 % (ref 11.0–15.0)
Total Lymphocyte: 37.3 %
WBC: 6.9 10*3/uL (ref 3.8–10.8)

## 2020-12-23 LAB — COMPLETE METABOLIC PANEL WITH GFR
AG Ratio: 1.8 (calc) (ref 1.0–2.5)
ALT: 77 U/L — ABNORMAL HIGH (ref 9–46)
AST: 28 U/L (ref 10–35)
Albumin: 4.6 g/dL (ref 3.6–5.1)
Alkaline phosphatase (APISO): 59 U/L (ref 35–144)
BUN: 21 mg/dL (ref 7–25)
CO2: 27 mmol/L (ref 20–32)
Calcium: 10.3 mg/dL (ref 8.6–10.3)
Chloride: 105 mmol/L (ref 98–110)
Creat: 0.95 mg/dL (ref 0.70–1.33)
GFR, Est African American: 108 mL/min/{1.73_m2} (ref >=60)
GFR, Est Non African American: 93 mL/min/{1.73_m2} (ref >=60)
Globulin: 2.6 g/dL (calc) (ref 1.9–3.7)
Glucose, Bld: 93 mg/dL (ref 65–139)
Potassium: 4.3 mmol/L (ref 3.5–5.3)
Sodium: 141 mmol/L (ref 135–146)
Total Bilirubin: 0.5 mg/dL (ref 0.2–1.2)
Total Protein: 7.2 g/dL (ref 6.1–8.1)

## 2020-12-23 NOTE — Patient Instructions (Signed)
Standing Labs We placed an order today for your standing lab work.   Please have your standing labs drawn in May and every 3 months   If possible, please have your labs drawn 2 weeks prior to your appointment so that the provider can discuss your results at your appointment.  We have open lab daily Monday through Thursday from 1:30-4:30 PM and Friday from 1:30-4:00 PM at the office of Dr. Shaili Deveshwar, [facility].   Please be advised, all patients with office appointments requiring lab work will take precedents over walk-in lab work.  If possible, please come for your lab work on Monday and Friday afternoons, as you may experience shorter wait times. The office is located at [address], [address], [place], [place] [address] No appointment is necessary.   Labs are drawn by [facility]. Please bring your co-pay at the time of your lab draw.  You may receive a bill from [facility] for your lab work.  If you wish to have your labs drawn at another location, please call the office 24 hours in advance to send orders.  If you have any questions regarding directions or hours of operation,  please call [phone].   As a reminder, please drink plenty of water prior to coming for your lab work. Thanks!   

## 2020-12-23 NOTE — Progress Notes (Signed)
LFTs are improving.  Please forward results to his PCP and GI

## 2021-01-21 ENCOUNTER — Telehealth: Payer: Self-pay

## 2021-01-21 NOTE — Telephone Encounter (Signed)
Patient called stating his [employer] patient assistance has expired for his Enbrel.  Patient states he received a new application and completed everything except for Dr. Fatima Sanger signature.  Patient would like to come by the office today, 3/25 to bring the form and pick up a sample of Enbrel.  Patient states he has been out of medication for a while.  Please advise.

## 2021-01-21 NOTE — Telephone Encounter (Addendum)
Patient states he has been out of enbrel for 2 weeks.   Labs: 12/22/2020 LFTs are improving. Tb gold: 07/12/2020 negative   Current dose per office note on 12/23/2020: Enbrel 50 mg sq once weekly.   Medication Samples have been provided to the patient.  Drug name: enbrel mini       Strength: 50mg         Qty: 1  LOT  Exp.Date: 01/2023  Dosing instructions: Inject 50mg  into the skin once weekly.   Pharmacy team will follow up on application.

## 2021-01-24 NOTE — Telephone Encounter (Signed)
Submitted Patient Assistance Application to [employer] for ENBREL along with provider portion and med list.  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   Fax# [phone] Phone# [phone]

## 2021-01-28 NOTE — Telephone Encounter (Signed)
Called patient , left message to call back. [employer] needs patient's updated estimated income (monthly or annually) and household size.

## 2021-02-02 NOTE — Telephone Encounter (Addendum)
Re-faxed provider portion to [employer] for Enbrel PAP

## 2021-02-15 NOTE — Telephone Encounter (Signed)
Received notification from  [employer] regarding an approval for ENBREL patient assistance from 02/01/21 to 02/01/22.   Phone number: [phone]  Patient setup order, it was delivered on 02/03/21.

## 2021-05-12 NOTE — Progress Notes (Signed)
Office Visit Note  Patient: John Blackwell             Date of Birth: Mar 08, 1971           MRN: [id number]             PCP: Patient, No Pcp Per (Inactive) Referring: No ref. provider found Visit Date: 05/26/2021 Occupation: @GUAROCC @  Subjective:  Medication monitoring  History of Present Illness: John Blackwell is a 50 y.o. male with history of seronegative rheumatoid arthritis.  He is on Enbrel 50 mg subcutaneous injections once weekly.  Patient reports that a couple of months ago he developed an upper respiratory infection and was off of Enbrel for 2 weeks.  He states that during that time he experienced increased pain in his left shoulder.  He was having difficulty sleeping at night as well as performing responsibilities at work due to the discomfort.  His left shoulder pain is since resolved and he has not had any other gaps in therapy.  He states that he experiences some arthralgias and stiffness in his joints in the afternoon after working.  He denies any nocturnal pain lately and his morning stiffness has only been lasting about 10 minutes daily.  He denies any new questions or concerns.      Activities of Daily Living:  Patient reports morning stiffness for 10-15 minutes.   Patient Denies nocturnal pain.  Difficulty dressing/grooming: Denies Difficulty climbing stairs: Denies Difficulty getting out of chair: Denies Difficulty using hands for taps, buttons, cutlery, and/or writing: Denies  Review of Systems  Constitutional:  Positive for fatigue.  HENT:  Negative for mouth sores, mouth dryness and nose dryness.   Eyes:  Negative for pain, itching and dryness.  Respiratory:  Negative for shortness of breath and difficulty breathing.   Cardiovascular:  Negative for chest pain and palpitations.  Gastrointestinal:  Negative for blood in stool, constipation and diarrhea.  Endocrine: Negative for increased urination.  Genitourinary:  Negative for difficulty urinating.   Musculoskeletal:  Positive for morning stiffness. Negative for joint pain, joint pain, joint swelling, myalgias, muscle tenderness and myalgias.  Skin:  Negative for color change, rash and redness.  Allergic/Immunologic: Negative for susceptible to infections.  Neurological:  Negative for dizziness, numbness, headaches, memory loss and weakness.  Hematological:  Negative for bruising/bleeding tendency.  Psychiatric/Behavioral:  Negative for confusion.    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   Alcohol: 3-5 Beers each week day.  Friday Nights: 12 pack                Saturday: 12 pack                Sundays: None   Says that in past drank more. And also drank liquor in past but rarely drinks any liquor now.      Never smoked.    Single.    Has Girlfriend.   Has lost 25 lbs in past year secondary to diet changes-stopped eating late night.(entered 07/2013)   Was lifting weights until past 2 months-stopped sec to pain.   Immunization History  Administered Date(s)  Administered   Tdap 01/31/2016     Objective: Vital Signs: BP (!) 146/90 (BP Location: Left Arm, Patient Position: Sitting, Cuff Size: Normal)   Pulse 69   Ht 5\' 8"  (1.727 m)   Wt 246 lb 9.6 oz (111.9 kg)   BMI 37.50 kg/m    Physical Exam Vitals and nursing note reviewed.  Constitutional:      Appearance: He is well-developed.  HENT:     Head: Normocephalic and atraumatic.  Eyes:     Conjunctiva/sclera: Conjunctivae normal.     Pupils: Pupils are equal, round, and reactive to light.  Pulmonary:     Effort: Pulmonary effort is normal.  Abdominal:     Palpations: Abdomen is soft.  Musculoskeletal:     Cervical back: Normal range of motion and neck supple.  Skin:    General: Skin is warm and dry.     Capillary Refill: Capillary refill takes less than 2 seconds.  Neurological:     Mental Status: He is alert and oriented to person, place, and time.  Psychiatric:        Behavior: Behavior normal.     Musculoskeletal Exam: C-spine, thoracic spine, lumbar spine good range of motion with no discomfort.  No midline spinal tenderness or SI joint tenderness.  Shoulder joints have good range of motion with some discomfort in the right shoulder.  Elbow joints, wrist joints, MCPs, PIPs, DIPs have good range of motion with no synovitis.  PIP and DIP prominence consistent with osteoarthritis of both hands noted.  Hip joints have good range of motion with no discomfort.  Knee joints have good range of motion with no warmth or effusion.  Ankle joints have good range of motion with no tenderness or joint swelling.  CDAI Exam: CDAI Score: 0.2  Patient Global: 1 mm; Provider Global: 1 mm Swollen: 0 ; Tender: 0  Joint Exam 05/26/2021   No joint exam has been documented for this visit   There is currently no information documented on the homunculus. Go to the Rheumatology activity and complete the homunculus joint exam.  Investigation: No additional findings.  Imaging: No results  found.  Recent Labs: Lab Results  Component Value Date   WBC 6.9 12/22/2020   HGB 16.7 12/22/2020   PLT 263 12/22/2020   NA 141 12/22/2020   K 4.3 12/22/2020   CL 105 12/22/2020   CO2 27 12/22/2020   GLUCOSE 93 12/22/2020   BUN 21 12/22/2020   CREATININE 0.95 12/22/2020   BILITOT 0.5 12/22/2020   ALKPHOS 58 08/27/2020   AST 28 12/22/2020   ALT 77 (H) 12/22/2020   PROT 7.2 12/22/2020   ALBUMIN 4.8 08/27/2020   CALCIUM 10.3 12/22/2020   GFRAA 108 12/22/2020   QFTBGOLDPLUS NEGATIVE 07/12/2020    Speciality Comments: No specialty comments available.  Procedures:  No procedures  performed Allergies: Patient has no known allergies.   Assessment / Plan:     Visit Diagnoses: Rheumatoid arthritis with rheumatoid factor of multiple sites without organ or systems involvement (HCC) - RF+, CCP+: He has no joint tenderness or synovitis on examination.  He had to miss 2 doses of Enbrel a couple of months ago due to having an upper respiratory tract infection.  During the gap in therapy he experienced increased pain in the left shoulder which resolved once he resumed weekly Enbrel injections.  He has not experiencing any increased joint pain or inflammation at this time.  He has not had any nocturnal pain.  His morning stiffness has been lasting about 10 minutes daily.  He continues to work a physically demanding job and occasionally has arthralgias and stiffness mid afternoon.  Overall he continues to find Enbrel to be effective at managing his rheumatoid arthritis.  He will remain on the current treatment regimen.  He was advised to notify us if he develops increased joint pain or joint swelling.  He will follow-up in the office in 5 months.  High risk medication use - Enbrel 50 mg sq once weekly. D/c MTX due to elevated LFTs.  CBC and CMP were drawn on 12/22/2020.  He is overdue to update lab work.  Orders for CBC and CMP were released.  His next lab work will be due in October and every 3  months to monitor for drug toxicity.  TB Gold negative on 07/12/2021.  Future order will be placed today.- Plan: CBC with Differential/Platelet, COMPLETE METABOLIC PANEL WITH GFR, QuantiFERON-TB Gold Plus Discussed the importance of holding Enbrel if he develops signs or symptoms of an infection and to resume once infection has completely cleared.  Other fatigue: Chronic but stable.  He works a physically demanding job.  Elevated LFTs - He was evaluated by Dr. Myrtie Neither on 08/27/2020 for persistently elevated LFTs.  He underwent an abdominal ultrasound on 09/03/2020 which revealed diffusely increased liver parenchymal echogenicity which is nonspecific but most commonly seen with hepatic steatosis. AST was 28 and ALT was 77 on 12/22/2020.  CMP ordered today.- Plan: COMPLETE METABOLIC PANEL WITH GFR  Screening for tuberculosis -Future order for TB gold placed today.  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    Follow-Up Instructions: Return in about 5 months (around 10/26/2021) for Rheumatoid arthritis.   Gearldine Bienenstock, PA-C  Note - This record has been created using Dragon software.  Chart creation errors have been sought, but may not always  have been located. Such creation errors do not reflect on  the standard of medical care.

## 2021-05-26 ENCOUNTER — Ambulatory Visit (INDEPENDENT_AMBULATORY_CARE_PROVIDER_SITE_OTHER): Payer: Self-pay | Admitting: Physician Assistant

## 2021-05-26 ENCOUNTER — Encounter: Payer: Self-pay | Admitting: Physician Assistant

## 2021-05-26 ENCOUNTER — Other Ambulatory Visit: Payer: Self-pay

## 2021-05-26 VITALS — BP 146/90 | HR 69 | Ht 68.0 in | Wt 246.6 lb

## 2021-05-26 DIAGNOSIS — F1010 Alcohol abuse, uncomplicated: Secondary | ICD-10-CM

## 2021-05-26 DIAGNOSIS — Z79899 Other long term (current) drug therapy: Secondary | ICD-10-CM

## 2021-05-26 DIAGNOSIS — R5383 Other fatigue: Secondary | ICD-10-CM

## 2021-05-26 DIAGNOSIS — R7989 Other specified abnormal findings of blood chemistry: Secondary | ICD-10-CM

## 2021-05-26 DIAGNOSIS — M0579 Rheumatoid arthritis with rheumatoid factor of multiple sites without organ or systems involvement: Secondary | ICD-10-CM

## 2021-05-26 DIAGNOSIS — Z111 Encounter for screening for respiratory tuberculosis: Secondary | ICD-10-CM

## 2021-05-26 DIAGNOSIS — A6002 Herpesviral infection of other male genital organs: Secondary | ICD-10-CM

## 2021-05-26 NOTE — Patient Instructions (Signed)
Standing Labs We placed an order today for your standing lab work.   Please have your standing labs drawn in October and every 3 months   If possible, please have your labs drawn 2 weeks prior to your appointment so that the provider can discuss your results at your appointment.  Please note that you may see your imaging and lab results in MyChart before we have reviewed them. We may be awaiting multiple results to interpret others before contacting you. Please allow our office up to 72 hours to thoroughly review all of the results before contacting the office for clarification of your results.  We have open lab daily: Monday through Thursday from 1:30-4:30 PM and Friday from 1:30-4:00 PM at the office of Dr. Shaili Deveshwar, [facility].   Please be advised, all patients with office appointments requiring lab work will take precedent over walk-in lab work.  If possible, please come for your lab work on Monday and Friday afternoons, as you may experience shorter wait times. The office is located at [address], [address], [place], [place] [address] No appointment is necessary.   Labs are drawn by [facility]. Please bring your co-pay at the time of your lab draw.  You may receive a bill from [facility] for your lab work.  If you wish to have your labs drawn at another location, please call the office 24 hours in advance to send orders.  If you have any questions regarding directions or hours of operation,  please call [phone].   As a reminder, please drink plenty of water prior to coming for your lab work. Thanks!  

## 2021-05-27 LAB — CBC WITH DIFFERENTIAL/PLATELET
Absolute Monocytes: 561 cells/uL (ref 200–950)
Basophils Absolute: 51 cells/uL (ref 0–200)
Basophils Relative: 1 %
Eosinophils Absolute: 199 cells/uL (ref 15–500)
Eosinophils Relative: 3.9 %
HCT: 46 % (ref 38.5–50.0)
Hemoglobin: 15.1 g/dL (ref 13.2–17.1)
Lymphs Abs: 1933 cells/uL (ref 850–3900)
MCH: 28.9 pg (ref 27.0–33.0)
MCHC: 32.8 g/dL (ref 32.0–36.0)
MCV: 88.1 fL (ref 80.0–100.0)
MPV: 11.4 fL (ref 7.5–12.5)
Monocytes Relative: 11 %
Neutro Abs: 2356 cells/uL (ref 1500–7800)
Neutrophils Relative %: 46.2 %
Platelets: 177 10*3/uL (ref 140–400)
RBC: 5.22 10*6/uL (ref 4.20–5.80)
RDW: 13 % (ref 11.0–15.0)
Total Lymphocyte: 37.9 %
WBC: 5.1 10*3/uL (ref 3.8–10.8)

## 2021-05-27 LAB — COMPLETE METABOLIC PANEL WITH GFR
AG Ratio: 1.9 (calc) (ref 1.0–2.5)
ALT: 98 U/L — ABNORMAL HIGH (ref 9–46)
AST: 33 U/L (ref 10–35)
Albumin: 4.5 g/dL (ref 3.6–5.1)
Alkaline phosphatase (APISO): 49 U/L (ref 35–144)
BUN: 18 mg/dL (ref 7–25)
CO2: 26 mmol/L (ref 20–32)
Calcium: 9.5 mg/dL (ref 8.6–10.3)
Chloride: 106 mmol/L (ref 98–110)
Creat: 1 mg/dL (ref 0.70–1.30)
Globulin: 2.4 g/dL (calc) (ref 1.9–3.7)
Glucose, Bld: 92 mg/dL (ref 65–99)
Potassium: 4.2 mmol/L (ref 3.5–5.3)
Sodium: 141 mmol/L (ref 135–146)
Total Bilirubin: 0.5 mg/dL (ref 0.2–1.2)
Total Protein: 6.9 g/dL (ref 6.1–8.1)
eGFR: 92 mL/min/{1.73_m2} (ref >=60)

## 2021-05-27 NOTE — Progress Notes (Signed)
ALT remains elevated-98.  AST WNL.  Rest of CMP WNL.  CBC WNL.  Pt was evaluated by GI in the past and had a abdominal ultrasound on 09/03/20. He should avoid tylenol, NSAIDs, and alcohol use.

## 2021-06-15 ENCOUNTER — Other Ambulatory Visit: Payer: Self-pay | Admitting: *Deleted

## 2021-06-15 MED ORDER — ENBREL MINI 50 MG/ML ~~LOC~~ SOCT: 50.0000 mg | SUBCUTANEOUS | 0 refills | Status: DC

## 2021-06-15 NOTE — Telephone Encounter (Signed)
Refill request received via fax  Next Visit: 11/01/2021  Last Visit: 05/26/2021  Last Fill: 03/02/2021  [id number] arthritis with rheumatoid factor of multiple sites without organ or systems involvement   Current Dose per office note 05/26/2021: Enbrel 50 mg sq once weekly  Labs: 05/26/2021 ALT remains elevated-98.  AST WNL.  Rest of CMP WNL.  CBC WNL.  TB Gold: 07/12/2020 Neg    Okay to refill Enbrel Mini?

## 2021-10-18 NOTE — Progress Notes (Signed)
Office Visit Note  Patient: John Blackwell             Date of Birth: 20-Oct-1971           MRN: [id number]             PCP: Patient, No Pcp Per (Inactive) Referring: No ref. provider found Visit Date: 11/01/2021 Occupation: @GUAROCC @  Subjective:  Medication management  History of Present Illness: John Blackwell is a 50 y.o. male with a history of rheumatoid arthritis and osteoarthritis overlap.  States he has been taking Enbrel on a weekly basis.  He has not experienced any side effects from Enbrel.  He has not noticed any joint swelling.  He notices some discomfort in his bilateral DIP joints.  He also climbs ladder at his work.  He feels some discomfort in his left knee off and on.  None of the other joints are painful.  Activities of Daily Living:  Patient reports morning stiffness for 0  none .   Patient Denies nocturnal pain.  Difficulty dressing/grooming: Denies Difficulty climbing stairs: Denies Difficulty getting out of chair: Denies Difficulty using hands for taps, buttons, cutlery, and/or writing: Denies  Review of Systems  Constitutional:  Negative for fatigue and night sweats.  HENT:  Negative for mouth sores, mouth dryness and nose dryness.   Eyes:  Negative for redness and dryness.  Respiratory:  Negative for shortness of breath and difficulty breathing.   Cardiovascular:  Negative for chest pain, palpitations, hypertension, irregular heartbeat and swelling in legs/feet.  Gastrointestinal:  Negative for constipation and diarrhea.  Endocrine: Negative for excessive thirst and increased urination.  Genitourinary:  Negative for difficulty urinating.  Musculoskeletal:  Positive for joint pain, joint pain and joint swelling. Negative for myalgias, muscle weakness, morning stiffness, muscle tenderness and myalgias.  Skin:  Negative for color change, rash, hair loss, nodules/bumps, skin tightness, ulcers and sensitivity to sunlight.  Allergic/Immunologic:  Negative for susceptible to infections.  Neurological:  Negative for dizziness, fainting, numbness, memory loss, night sweats and weakness ( ).  Hematological:  Negative for bruising/bleeding tendency and swollen glands.  Psychiatric/Behavioral:  Negative for depressed mood and sleep disturbance. The patient is not nervous/anxious.    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 Climbs Ladders.   Alcohol: 3-5 Beers each week day.                Friday Nights: 12 pack  Saturday: 12 pack                Sundays: None   Says that in past drank more. And also drank liquor in past but rarely drinks any liquor now.      Never smoked.    Single.    Has Girlfriend.   Has lost 25 lbs in past year secondary to diet changes-stopped eating late night.(entered 07/2013)   Was lifting weights until past 2 months-stopped sec to pain.   Immunization History  Administered Date(s) Administered   Tdap 01/31/2016     Objective: Vital Signs: BP 120/77 (BP Location: Left Arm, Patient Position: Sitting, Cuff  Size: Normal)    Pulse 66    Resp 15    Ht 5\' 8"  (1.727 m)    Wt 240 lb (108.9 kg)    BMI 36.49 kg/m    Physical Exam Vitals and nursing note reviewed.  Constitutional:      Appearance: He is well-developed.  HENT:     Head: Normocephalic and atraumatic.  Eyes:     Conjunctiva/sclera: Conjunctivae normal.     Pupils: Pupils are equal, round, and reactive to light.  Cardiovascular:     Rate and Rhythm: Normal rate and regular rhythm.     Heart sounds: Normal heart sounds.  Pulmonary:     Effort: Pulmonary effort is normal.     Breath sounds: Normal breath sounds.  Abdominal:     General: Bowel sounds are normal.     Palpations: Abdomen is soft.  Musculoskeletal:     Cervical back: Normal range of motion and neck supple.  Skin:    General: Skin is warm and dry.     Capillary Refill: Capillary refill takes less than 2 seconds.  Neurological:     Mental Status: He is alert and oriented to person, place, and time.  Psychiatric:        Behavior: Behavior normal.     Musculoskeletal Exam: C-spine was in good range of motion.  Shoulder joints, elbow joints, wrist joints, MCPs PIPs and DIPs with good range of motion with no synovitis.  He had bilateral DIP thickening.  Hip joints and knee joints in good range of motion.  There was no tenderness over ankles or MTPs or PIPs.  CDAI Exam: CDAI Score: -- Patient Global: --; Provider Global: -- Swollen: --; Tender: -- Joint Exam 11/01/2021   No joint exam has been documented for this visit   There is currently no information documented on the homunculus. Go to the Rheumatology activity and complete the homunculus joint exam.  Investigation: No additional findings.  Imaging: No results found.  Recent Labs: Lab Results  Component Value Date   WBC 5.1 05/26/2021   HGB 15.1 05/26/2021   PLT 177 05/26/2021   NA 141 05/26/2021   K 4.2 05/26/2021   CL 106 05/26/2021   CO2 26 05/26/2021   GLUCOSE 92 05/26/2021   BUN 18  05/26/2021   CREATININE 1.00 05/26/2021   BILITOT 0.5 05/26/2021   ALKPHOS 58 08/27/2020   AST 33 05/26/2021   ALT 98 (H) 05/26/2021   PROT 6.9 05/26/2021   ALBUMIN 4.8 08/27/2020   CALCIUM 9.5 05/26/2021   GFRAA 108 12/22/2020   QFTBGOLDPLUS NEGATIVE 07/12/2020    Speciality Comments: No specialty comments available.  Procedures:  No procedures performed Allergies: Patient has no known allergies.   Assessment / Plan:     Visit Diagnoses: Rheumatoid arthritis with rheumatoid factor of multiple  sites without organ or systems involvement (HCC) - RF+, CCP+: He had no synovitis on my examination.  He has been tolerating Enbrel without any side effects.  We will continue current treatment.  He complains of some stiffness in his hands which most likely is coming from osteoarthritis.  He works on gutters and Dentist.  High risk medication use - Enbrel 50 mg sq once weekly. D/c MTX due to elevated LFTs.  Labs from May 26, 2021 were reviewed which showed elevated ALT at 98.  TB gold was negative on July 12, 2020.- Plan: CBC with Differential/Platelet, COMPLETE METABOLIC PANEL WITH GFR today and then every 3 months.  He was advised to stop Enbrel in case he develops an infection.  Information regarding realization was placed in the AVS.  Annual skin examination was advised while he is taking Enbrel to screen for skin cancer.  Elevated LFTs - He was evaluated by Dr. Myrtie Neither on 08/27/2020 for persistently elevated LFTs.  He underwent an abdominal ultrasound on 09/03/2020.  His LFTs have improved but he still persistently elevated.  Primary osteoarthritis of both hands-he complains of some discomfort in his DIP joints.  Clinical findings are consistent with osteoarthritis.  Alcohol abuse - 3beers a day.  Cutting back on alcohol consumption was discussed.  BMI-36.49 increased risk of heart disease with rheumatoid arthritis was discussed.  Regular exercise and dietary modifications were  discussed.  A handout was placed in the AVS.  Patient states that he had some intentional weight loss but he gained weight during the holidays.  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  Orders: Orders Placed This Encounter  Procedures   CBC with Differential/Platelet   COMPLETE METABOLIC PANEL WITH GFR   No orders of the defined types were placed in this encounter.    Follow-Up Instructions: Return in about 5 months (around 04/01/2022) for Rheumatoid arthritis.   Pollyann Savoy, MD  Note - This record has been created using Animal nutritionist.  Chart creation errors have been sought, but may not always  have been located. Such creation errors do not reflect on  the standard of medical care.

## 2021-11-01 ENCOUNTER — Ambulatory Visit (INDEPENDENT_AMBULATORY_CARE_PROVIDER_SITE_OTHER): Payer: Self-pay | Admitting: Rheumatology

## 2021-11-01 ENCOUNTER — Encounter: Payer: Self-pay | Admitting: Rheumatology

## 2021-11-01 ENCOUNTER — Other Ambulatory Visit: Payer: Self-pay

## 2021-11-01 VITALS — BP 120/77 | HR 66 | Resp 15 | Ht 68.0 in | Wt 240.0 lb

## 2021-11-01 DIAGNOSIS — A6002 Herpesviral infection of other male genital organs: Secondary | ICD-10-CM

## 2021-11-01 DIAGNOSIS — Z79899 Other long term (current) drug therapy: Secondary | ICD-10-CM

## 2021-11-01 DIAGNOSIS — Z6836 Body mass index (BMI) 36.0-36.9, adult: Secondary | ICD-10-CM

## 2021-11-01 DIAGNOSIS — F1010 Alcohol abuse, uncomplicated: Secondary | ICD-10-CM

## 2021-11-01 DIAGNOSIS — M19042 Primary osteoarthritis, left hand: Secondary | ICD-10-CM

## 2021-11-01 DIAGNOSIS — R7989 Other specified abnormal findings of blood chemistry: Secondary | ICD-10-CM

## 2021-11-01 DIAGNOSIS — M19041 Primary osteoarthritis, right hand: Secondary | ICD-10-CM

## 2021-11-01 DIAGNOSIS — M0579 Rheumatoid arthritis with rheumatoid factor of multiple sites without organ or systems involvement: Secondary | ICD-10-CM

## 2021-11-01 LAB — COMPLETE METABOLIC PANEL WITH GFR
AG Ratio: 1.7 (calc) (ref 1.0–2.5)
ALT: 60 U/L — ABNORMAL HIGH (ref 9–46)
AST: 24 U/L (ref 10–35)
Albumin: 4.5 g/dL (ref 3.6–5.1)
Alkaline phosphatase (APISO): 55 U/L (ref 35–144)
BUN: 17 mg/dL (ref 7–25)
CO2: 28 mmol/L (ref 20–32)
Calcium: 9.8 mg/dL (ref 8.6–10.3)
Chloride: 107 mmol/L (ref 98–110)
Creat: 1.03 mg/dL (ref 0.70–1.30)
Globulin: 2.7 g/dL (calc) (ref 1.9–3.7)
Glucose, Bld: 78 mg/dL (ref 65–99)
Potassium: 4.5 mmol/L (ref 3.5–5.3)
Sodium: 143 mmol/L (ref 135–146)
Total Bilirubin: 0.5 mg/dL (ref 0.2–1.2)
Total Protein: 7.2 g/dL (ref 6.1–8.1)
eGFR: 88 mL/min/{1.73_m2} (ref >=60)

## 2021-11-01 LAB — CBC WITH DIFFERENTIAL/PLATELET
Absolute Monocytes: 672 cells/uL (ref 200–950)
Basophils Absolute: 42 cells/uL (ref 0–200)
Basophils Relative: 0.7 %
Eosinophils Absolute: 240 cells/uL (ref 15–500)
Eosinophils Relative: 4 %
HCT: 48.7 % (ref 38.5–50.0)
Hemoglobin: 15.6 g/dL (ref 13.2–17.1)
Lymphs Abs: 1806 cells/uL (ref 850–3900)
MCH: 28.9 pg (ref 27.0–33.0)
MCHC: 32 g/dL (ref 32.0–36.0)
MCV: 90.4 fL (ref 80.0–100.0)
MPV: 11.1 fL (ref 7.5–12.5)
Monocytes Relative: 11.2 %
Neutro Abs: 3240 cells/uL (ref 1500–7800)
Neutrophils Relative %: 54 %
Platelets: 188 10*3/uL (ref 140–400)
RBC: 5.39 10*6/uL (ref 4.20–5.80)
RDW: 12.7 % (ref 11.0–15.0)
Total Lymphocyte: 30.1 %
WBC: 6 10*3/uL (ref 3.8–10.8)

## 2021-11-01 NOTE — Patient Instructions (Addendum)
Please get annual skin examination to screen for skin cancer while you are on Enbrel.  Please use and skin protection by using 50 SPF sunscreen   Standing Labs We placed an order today for your standing lab work.   Please have your standing labs drawn in April and every 3 months  If possible, please have your labs drawn 2 weeks prior to your appointment so that the provider can discuss your results at your appointment.  Please note that you may see your imaging and lab results in MyChart before we have reviewed them. We may be awaiting multiple results to interpret others before contacting you. Please allow our office up to 72 hours to thoroughly review all of the results before contacting the office for clarification of your results.  We have open lab daily: Monday through Thursday from 1:30-4:30 PM and Friday from 1:30-4:00 PM at the office of Dr. Pollyann Savoy, [facility].   Please be advised, all patients with office appointments requiring lab work will take precedent over walk-in lab work.  If possible, please come for your lab work on Monday and Friday afternoons, as you may experience shorter wait times. The office is located at [address], [place], [place] [address] No appointment is necessary.   Labs are drawn by [facility]. Please bring your co-pay at the time of your lab draw.  You may receive a bill from [facility] for your lab work.  If you wish to have your labs drawn at another location, please call the office 24 hours in advance to send orders.  If you have any questions regarding directions or hours of operation,  please call [phone].   As a reminder, please drink plenty of water prior to coming for your lab work. Thanks!   If you have signs or symptoms of an infection or start antibiotics: First, call your PCP for workup of your infection. Hold your medication through the infection, until you complete your antibiotics, and until symptoms  resolve if you take the following: Injectable medication (Actemra, Benlysta, Cimzia, Cosentyx, Enbrel, Humira, Kevzara, Orencia, Remicade, Simponi, Stelara, Taltz, Tremfya) Methotrexate Leflunomide (Arava) Mycophenolate (Cellcept) Harriette Ohara, Olumiant, or Rinvoq  Vaccines You are taking a medication(s) that can suppress your immune system.  The following immunizations are recommended: Flu annually Covid-19  Td/Tdap (tetanus, diphtheria, pertussis) every 10 years Pneumonia (Prevnar 15 then Pneumovax 23 at least 1 year apart.  Alternatively, can take Prevnar 20 without needing additional dose) Shingrix: 2 doses from 4 weeks to 6 months apart  Please check with your PCP to make sure you are up to date.  Heart Disease Prevention   Your inflammatory disease increases your risk of heart disease which includes heart attack, stroke, atrial fibrillation (irregular heartbeats), high blood pressure, heart failure and atherosclerosis (plaque in the arteries).  It is important to reduce your risk by:   Keep blood pressure, cholesterol, and blood sugar at healthy levels   Smoking Cessation   Maintain a healthy weight  BMI 20-25   Eat a healthy diet  Plenty of fresh fruit, vegetables, and whole grains  Limit saturated fats, foods high in sodium, and added sugars  DASH and Mediterranean diet   Increase physical activity  Recommend moderate physically activity for 150 minutes per week/ 30 minutes a day for five days a week These can be broken up into three separate ten-minute sessions during the day.   Reduce Stress  Meditation, slow breathing exercises, yoga, coloring books  Dental  visits twice a year

## 2021-11-02 NOTE — Progress Notes (Signed)
CBC is normal.  CMP shows elevated LFTs.  Liver functions are better but still elevated.  Please forward results to Dr. Loletha Carrow.

## 2021-11-09 ENCOUNTER — Other Ambulatory Visit: Payer: Self-pay | Admitting: Rheumatology

## 2021-11-09 MED ORDER — ENBREL MINI 50 MG/ML ~~LOC~~ SOCT: 50.0000 mg | SUBCUTANEOUS | 0 refills | Status: DC

## 2021-11-09 NOTE — Telephone Encounter (Signed)
Patient called the office requesting a refill of Embrel 50mg /ml to be sent to [facility]

## 2021-11-09 NOTE — Telephone Encounter (Signed)
Next Visit: 04/03/2022  Last Visit: 11/01/2021  Last Fill: 06/15/2021  [id number] arthritis with rheumatoid factor of multiple sites without organ or systems involvement   Current Dose per office note 11/01/2021: Enbrel 50 mg sq once weekly  Labs: 11/01/2021 CBC is normal.  CMP shows elevated LFTs.  Liver functions are better but still elevated.  TB Gold: 07/12/2020 Neg   Okay to refill Enbrel?

## 2021-11-09 NOTE — Addendum Note (Signed)
Addended by: Carole Binning on: 11/09/2021 01:37 PM   Modules accepted: Orders

## 2022-03-10 IMAGING — US US ABDOMEN LIMITED
1 series · 14 of 25 positions shown · non-contrast
Comparison: CT abdomen pelvis 04/11/2016

CLINICAL DATA: Transaminitis

EXAM:
ULTRASOUND ABDOMEN LIMITED RIGHT UPPER QUADRANT

[Series 1: us abdomen limited · 14 of 33 slices shown]
[im 1/33]
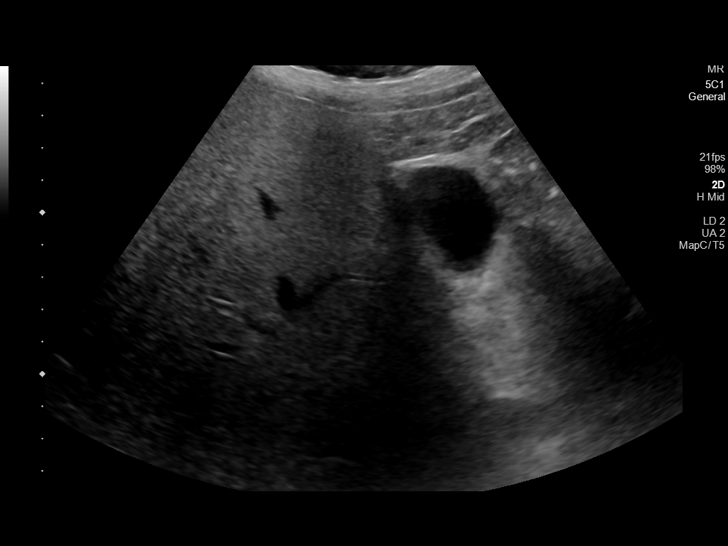
[im 3/33]
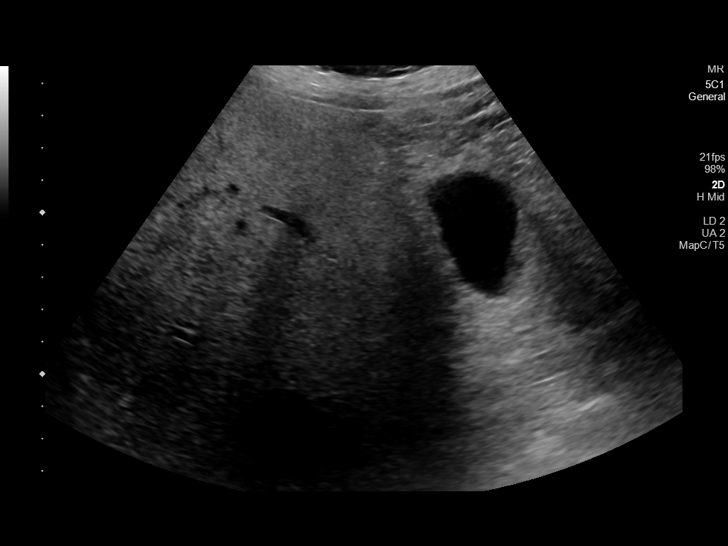
[im 6/33]
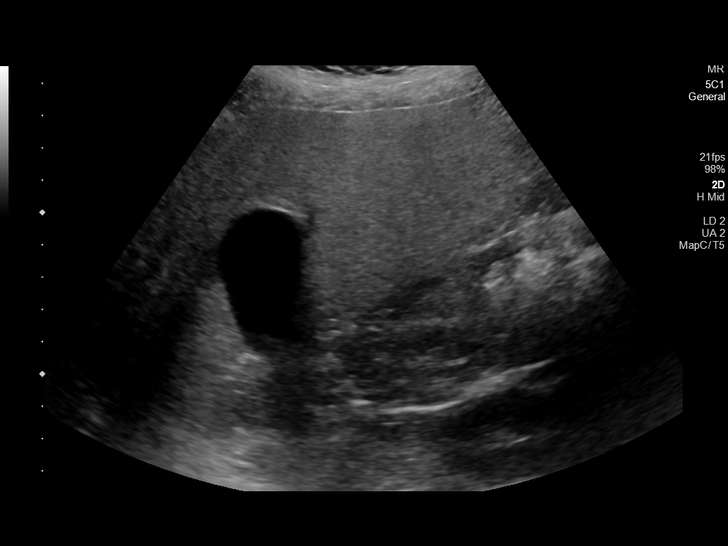
[im 9/33]
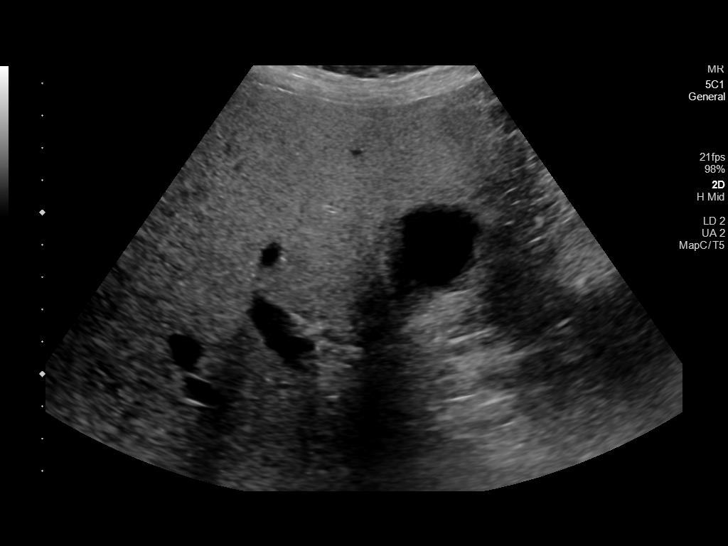
[im 11/33]
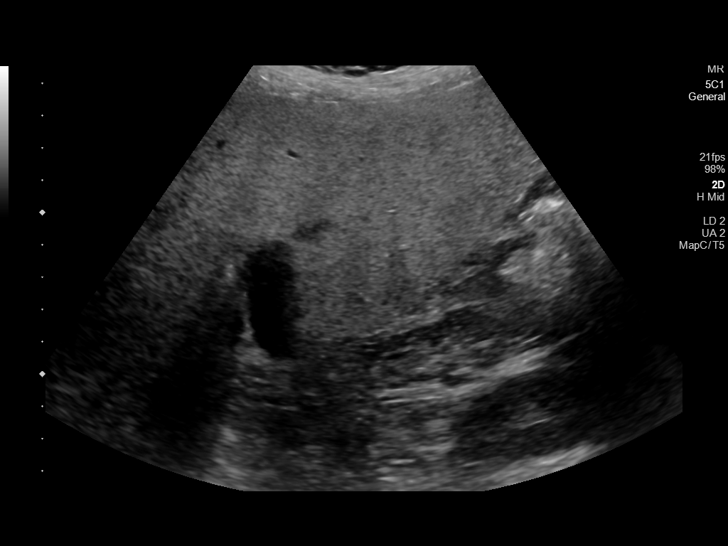
[im 13/33]
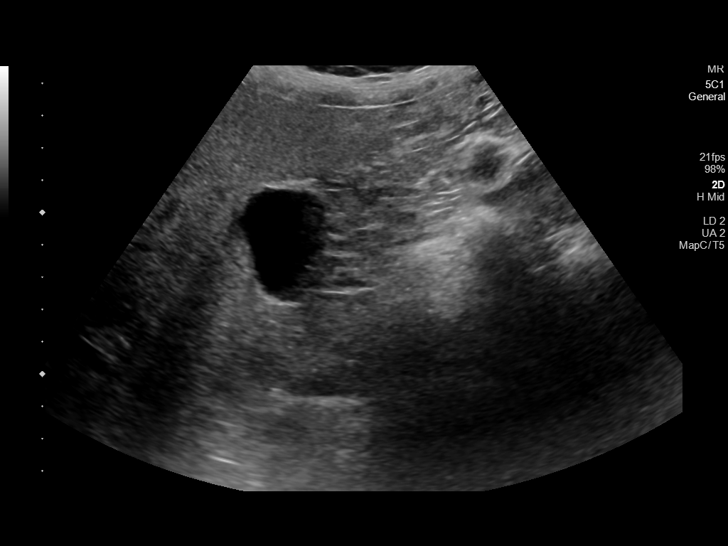
[im 15/33]
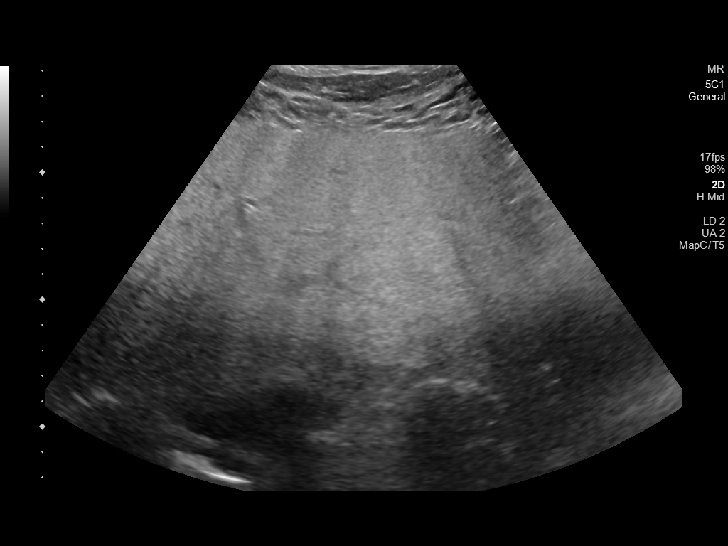
[im 18/33]
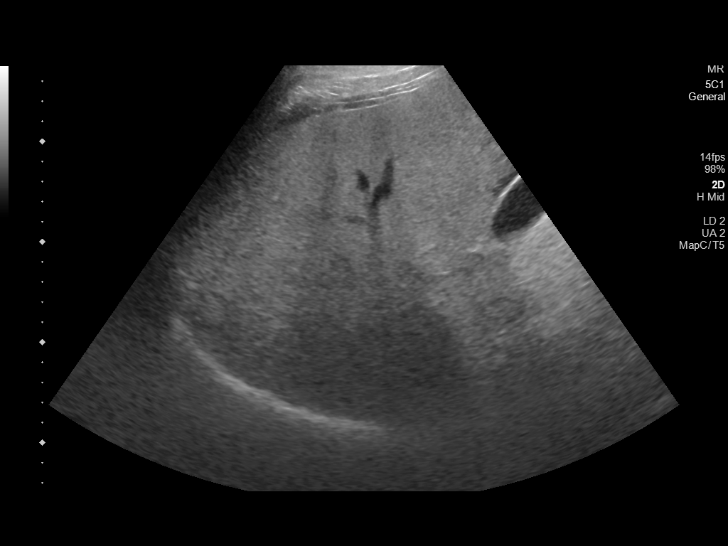
[im 21/33]
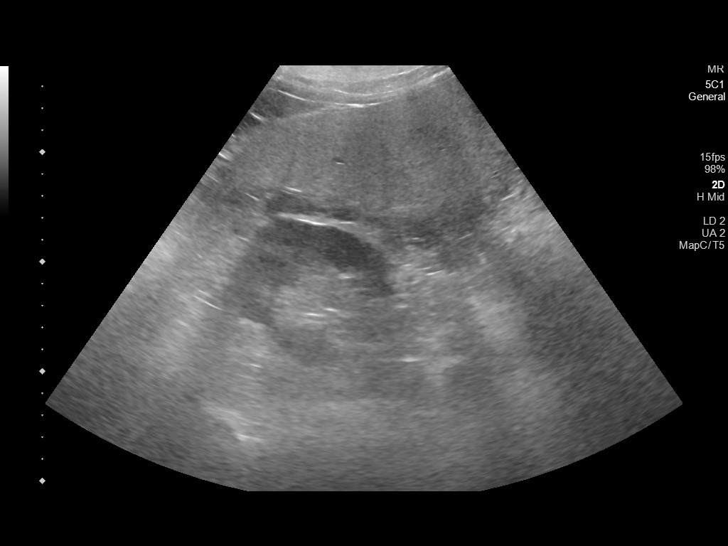
[im 22/33]
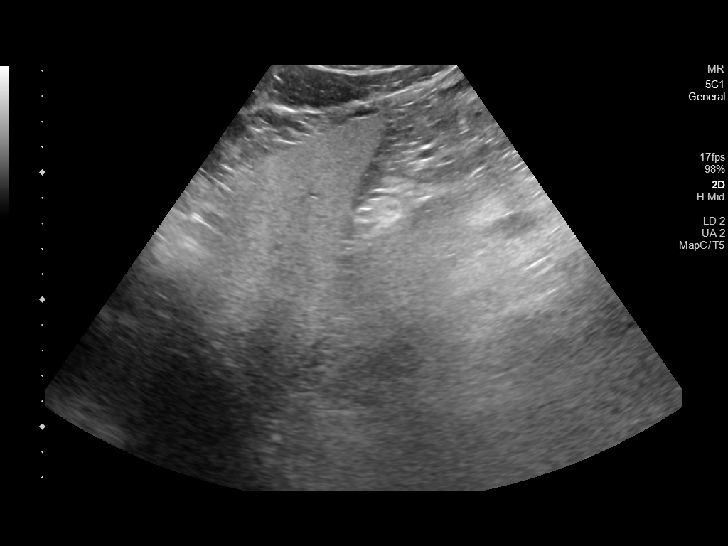
[im 25/33]
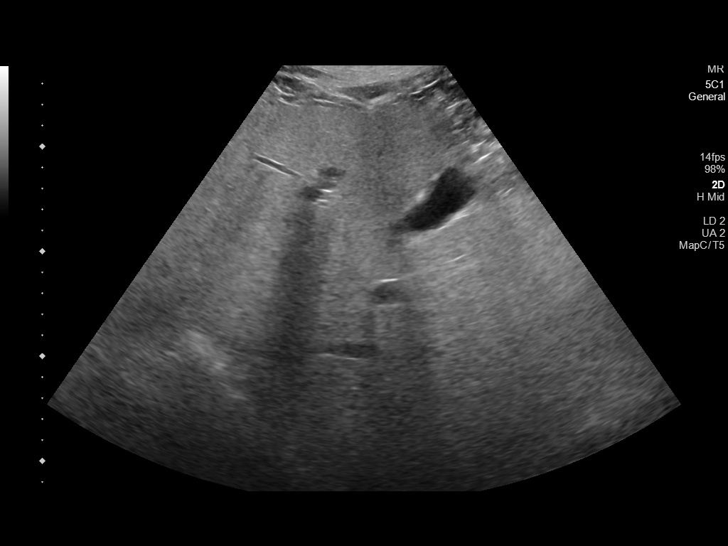
[im 27/33]
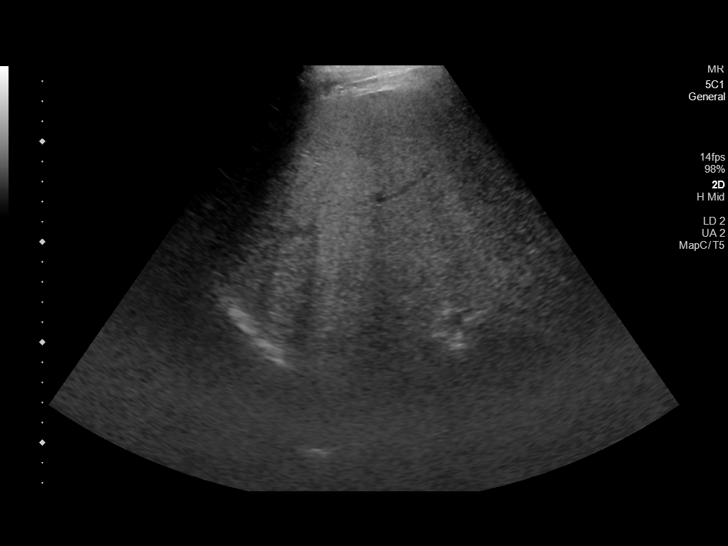
[im 30/33]
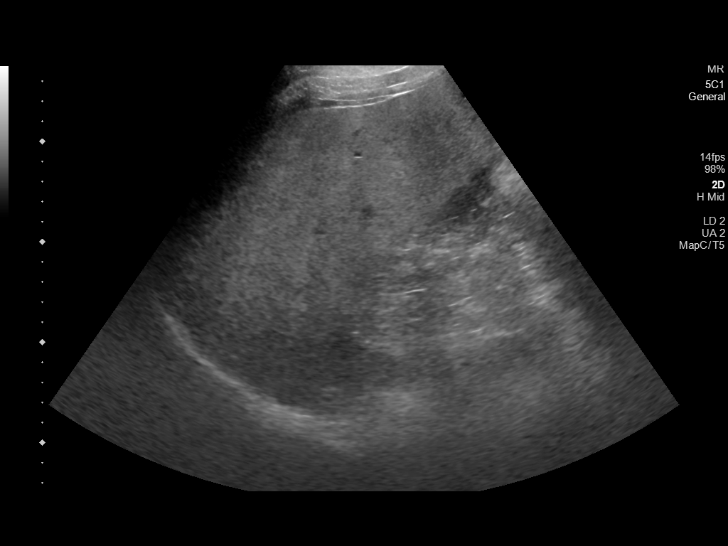
[im 33/33]
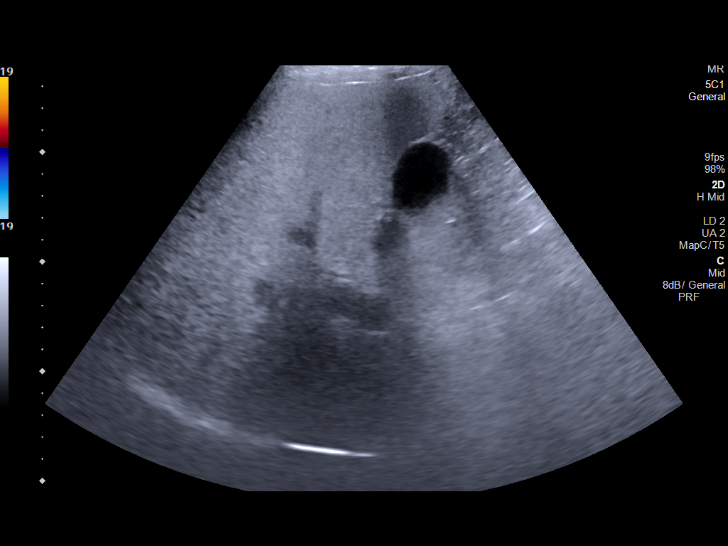

[14 of 25 positions shown; findings below may reference images not displayed]

FINDINGS: Gallbladder:

No gallstones or wall thickening visualized. No sonographic Murphy
sign noted by sonographer.

Common bile duct:

Diameter: 0.4 cm, within normal limits.

Liver:

No focal lesion identified. Parenchymal echogenicity is diffusely
increased. Portal vein is patent on color Doppler imaging with
normal direction of blood flow towards the liver.

Other: None.
IMPRESSION: Diffusely increased liver parenchymal echogenicity which is
nonspecific but most commonly seen with hepatic steatosis.

## 2022-03-20 NOTE — Progress Notes (Signed)
Office Visit Note  Patient: John Blackwell             Date of Birth: 07-30-1971           MRN: [id number]             PCP: Patient, No Pcp Per (Inactive) Referring: No ref. provider found Visit Date: 04/03/2022 Occupation: @  Subjective:  Pain in both knees   History of Present Illness: John Blackwell is a 51 y.o. male with history of seropositive rheumatoid arthritis and osteoarthritis.  Patient is currently on Enbrel 50 mg subcutaneous injections once weekly.  He has not missed any doses of Enbrel recently.  He continues to tolerate Enbrel without any side effects or injection site reactions.  He states over the past several months has been experiencing increased soreness in both knees.  He denies any nocturnal symptoms or mechanical symptoms at this time.  He has not noticed any warmth or swelling in his knee joints.  He experiences stiffness after sitting for prolonged periods of time which is worse in the late afternoon and evenings.  He states he continues to have occasional soreness in his hands but denies any joint swelling.  He denies any other joint pain or joint inflammation at this time. He denies any recent infections.   Activities of Daily Living:  Patient reports morning stiffness for 15 minutes.   Patient Denies nocturnal pain.  Difficulty dressing/grooming: Denies Difficulty climbing stairs: Denies Difficulty getting out of chair: Denies Difficulty using hands for taps, buttons, cutlery, and/or writing: Denies  Review of Systems  Constitutional:  Negative for fatigue.  HENT:  Negative for mouth dryness.   Eyes:  Negative for dryness.  Respiratory:  Negative for shortness of breath.   Cardiovascular:  Negative for swelling in legs/feet.  Gastrointestinal:  Negative for constipation.  Endocrine: Negative for excessive thirst.  Genitourinary:  Negative for difficulty urinating.  Musculoskeletal:  Positive for joint pain, joint pain and morning  stiffness.  Skin:  Negative for rash.  Allergic/Immunologic: Negative for susceptible to infections.  Neurological:  Negative for numbness.  Hematological:  Negative for bruising/bleeding tendency.  Psychiatric/Behavioral:  Negative for sleep disturbance.    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 Climbs Ladders.   Alcohol: 3-5 Beers each week day.                Friday Nights: 12 pack                Saturday:  12 pack                Sundays: None   Says that in past drank more. And also drank liquor in past but rarely drinks any liquor now.      Never smoked.    Single.    Has Girlfriend.   Has lost 25 lbs in past year secondary to diet changes-stopped eating late night.(entered 07/2013)   Was lifting weights until past 2 months-stopped sec to pain.   Immunization History  Administered Date(s) Administered   Tdap 01/31/2016     Objective: Vital Signs: BP 130/86 (BP Location: Left Arm, Patient Position: Sitting, Cuff Size: Normal)   Pulse 87   Resp 16   Ht 5\' 8"  (1.727 m)   Wt 241 lb  (109.3 kg)   BMI 36.64 kg/m    Physical Exam Vitals and nursing note reviewed.  Constitutional:      Appearance: He is well-developed.  HENT:     Head: Normocephalic and atraumatic.  Eyes:     Conjunctiva/sclera: Conjunctivae normal.     Pupils: Pupils are equal, round, and reactive to light.  Cardiovascular:     Rate and Rhythm: Normal rate and regular rhythm.     Heart sounds: Normal heart sounds.  Pulmonary:     Effort: Pulmonary effort is normal.     Breath sounds: Normal breath sounds.  Abdominal:     General: Bowel sounds are normal.     Palpations: Abdomen is soft.  Musculoskeletal:     Cervical back: Normal range of motion and neck supple.  Skin:    General: Skin is warm and dry.     Capillary Refill: Capillary refill takes less than 2 seconds.  Neurological:     Mental Status: He is alert and oriented to person, place, and time.  Psychiatric:        Behavior: Behavior normal.     Musculoskeletal Exam: C-spine, thoracic spine, and lumbar spine good ROM.  No midline spinal tenderness.  Shoulder joints have good ROM with some stiffness with internal rotation.  Elbow joints, wrist joints, MCPs, PIPs, and DIPs good ROM with no synovitis.  Complete fist formation bilaterally.  Hip joints, knee joints, and ankle joint have good ROM with no discomfort.  No warmth or effusion of knee joints.  No tenderness or swelling of ankle joints.   CDAI Exam: CDAI Score: -- Patient Global: --; Provider Global: -- Swollen: --; Tender: -- Joint Exam 04/03/2022   No joint exam has been documented for this visit   There is currently no information documented on the homunculus. Go to the Rheumatology activity and complete the homunculus joint exam.  Investigation: No additional findings.  Imaging: No results found.  Recent Labs: Lab Results  Component Value Date   WBC 6.0 11/01/2021   HGB 15.6 11/01/2021   PLT 188 11/01/2021   NA 143 11/01/2021   K 4.5 11/01/2021   CL 107  11/01/2021   CO2 28 11/01/2021   GLUCOSE 78 11/01/2021   BUN 17 11/01/2021   CREATININE 1.03 11/01/2021   BILITOT 0.5 11/01/2021   ALKPHOS 58 08/27/2020   AST 24 11/01/2021   ALT 60 (H) 11/01/2021   PROT 7.2 11/01/2021   ALBUMIN 4.8 08/27/2020   CALCIUM 9.8 11/01/2021   GFRAA 108 12/22/2020   QFTBGOLDPLUS NEGATIVE 07/12/2020    Speciality Comments: No specialty comments available.  Procedures:  No procedures performed Allergies: Patient has no known allergies.  Assessment / Plan:     Visit Diagnoses: Rheumatoid arthritis with rheumatoid factor of multiple sites without organ or systems involvement (HCC) - RF+, CCP+: He has no synovitis on examination today.  He presents today with increased pain and stiffness in both knee joints over the past several months.  He has not been experiencing any mechanical symptoms and has no warmth or effusion on examination today.  He declined updated x-rays of his knees for further evaluation.  He would like to try conservative treatment measures including lower extremity muscle strengthening as well as topical agents.  He is not experiencing any other joint pain or inflammation at this time.  Overall his rheumatoid arthritis has remained well controlled on Enbrel 3 mg subcutaneous injections once weekly.  He has been tolerating Enbrel without any side effects or injection site reactions.  He will remain on Enbrel as monotherapy.  He was advised to notify us if he develops increased joint pain or joint swelling.  He will follow-up in the office in 5 months or sooner if needed.   High risk medication use - Enbrel 50 mg sq injections once weekly. D/c MTX due to elevated LFTs. He takes tylenol very sparingly.  CBC and CMP updated on 11/01/21.  He plans on having updated lab work today at [facility].  Orders for CBC and CMP were released.  His next lab work will be due in September and every 3 months to monitor for drug toxicity. TB gold negative on 07/12/20.   Patient is overdue to update TB gold.  Order for TB Gold release today. - Plan: QuantiFERON-TB Gold Plus, CBC with Differential/Platelet, COMPLETE METABOLIC PANEL WITH GFR Discussed the importance of holding enbrel if he develops signs or symptoms of an infection and to resume once the infection has completely cleared. Discussed the importance of yearly skin examinations while on Enbrel due to the increased risk for nonmelanoma skin cancer.   Screening for tuberculosis -Ordered for TB Gold release today.   Elevated LFTs - He was evaluated by Dr. Myrtie Neitheranis on 08/27/2020 for persistently elevated LFTs.  He underwent an abdominal ultrasound on 09/03/2020.  He tries to avoid the use of Tylenol.  AST was 24 and ALT was 60 on 11/01/2021.  He is due to update lab work today.- Plan: COMPLETE METABOLIC PANEL WITH GFR  Primary osteoarthritis of both hands: He has PIP and DIP thickening consistent with osteoarthritis of both hands.  No tenderness or inflammation was noted on examination today.  He was able to make a complete fist bilaterally.  He has occasional pain and stiffness in both hands but had no active inflammation.  Discussed the importance of joint protection and muscle strengthening.  Chronic pain of both knees: He has good range of motion of both knee joints on examination.  No warmth or effusion was noted.  No instability or mechanical symptoms. He describes his symptoms as a soreness in both knees especially in the late afternoon and evenings.  He has not been experiencing any clinical symptoms or nocturnal pain.  He has not had any recent injury or fall prior to the onset of his symptoms several months ago.  Different treatment options were discussed today in detail including physical therapy, cortisone injections, Visco gel injections, and the use of topical agents such as Voltaren gel.  He declined updated x-rays at this time.  He would like to try home exercises first.  He was given a handout of  exercises to perform.  I also  discussed the use of Voltaren gel which she can apply topically up to 4 times daily as needed for symptomatic relief.  He was advised to notify his if his symptoms persist or worsen.  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  Orders: Orders Placed This Encounter  Procedures   QuantiFERON-TB Gold Plus   CBC with Differential/Platelet   COMPLETE METABOLIC PANEL WITH GFR   No orders of the defined types were placed in this encounter.   Follow-Up Instructions: Return in about 5 months (around 09/03/2022) for Rheumatoid arthritis, Osteoarthritis.   Gearldine Bienenstock, PA-C  Note - This record has been created using Dragon software.  Chart creation errors have been sought, but may not always  have been located. Such creation errors do not reflect on  the standard of medical care.

## 2022-04-03 ENCOUNTER — Encounter: Payer: Self-pay | Admitting: Physician Assistant

## 2022-04-03 ENCOUNTER — Ambulatory Visit (INDEPENDENT_AMBULATORY_CARE_PROVIDER_SITE_OTHER): Payer: Self-pay | Admitting: Physician Assistant

## 2022-04-03 VITALS — BP 130/86 | HR 87 | Resp 16 | Ht 68.0 in | Wt 241.0 lb

## 2022-04-03 DIAGNOSIS — F1010 Alcohol abuse, uncomplicated: Secondary | ICD-10-CM

## 2022-04-03 DIAGNOSIS — M19042 Primary osteoarthritis, left hand: Secondary | ICD-10-CM

## 2022-04-03 DIAGNOSIS — G8929 Other chronic pain: Secondary | ICD-10-CM

## 2022-04-03 DIAGNOSIS — Z111 Encounter for screening for respiratory tuberculosis: Secondary | ICD-10-CM

## 2022-04-03 DIAGNOSIS — M19041 Primary osteoarthritis, right hand: Secondary | ICD-10-CM

## 2022-04-03 DIAGNOSIS — M0579 Rheumatoid arthritis with rheumatoid factor of multiple sites without organ or systems involvement: Secondary | ICD-10-CM

## 2022-04-03 DIAGNOSIS — M25561 Pain in right knee: Secondary | ICD-10-CM

## 2022-04-03 DIAGNOSIS — M25562 Pain in left knee: Secondary | ICD-10-CM

## 2022-04-03 DIAGNOSIS — Z79899 Other long term (current) drug therapy: Secondary | ICD-10-CM

## 2022-04-03 DIAGNOSIS — R7989 Other specified abnormal findings of blood chemistry: Secondary | ICD-10-CM

## 2022-04-03 DIAGNOSIS — A6002 Herpesviral infection of other male genital organs: Secondary | ICD-10-CM

## 2022-04-03 NOTE — Patient Instructions (Signed)
Standing Labs We placed an order today for your standing lab work.   Please have your standing labs drawn in September and every 3 months   If possible, please have your labs drawn 2 weeks prior to your appointment so that the provider can discuss your results at your appointment.  Please note that you may see your imaging and lab results in MyChart before we have reviewed them. We may be awaiting multiple results to interpret others before contacting you. Please allow our office up to 72 hours to thoroughly review all of the results before contacting the office for clarification of your results.  We have open lab daily: Monday through Thursday from 1:30-4:30 PM and Friday from 1:30-4:00 PM at the office of Dr. Pollyann Savoy, [facility].   Please be advised, all patients with office appointments requiring lab work will take precedent over walk-in lab work.  If possible, please come for your lab work on Monday and Friday afternoons, as you may experience shorter wait times. The office is located at [address], [place], [place] [address] No appointment is necessary.   Labs are drawn by [facility]. Please bring your co-pay at the time of your lab draw.  You may receive a bill from [facility] for your lab work.  Please note if you are on Hydroxychloroquine and and an order has been placed for a Hydroxychloroquine level, you will need to have it drawn 4 hours or more after your last dose.  If you wish to have your labs drawn at another location, please call the office 24 hours in advance to send orders.  If you have any questions regarding directions or hours of operation,  please call [phone].   As a reminder, please drink plenty of water prior to coming for your lab work. Thanks!  Knee Exercises Ask your health care provider which exercises are safe for you. Do exercises exactly as told by your health care provider and adjust them as directed. It is normal to feel  mild stretching, pulling, tightness, or discomfort as you do these exercises. Stop right away if you feel sudden pain or your pain gets worse. Do not begin these exercises until told by your health care provider. Stretching and range-of-motion exercises These exercises warm up your muscles and joints and improve the movement and flexibility of your knee. These exercises also help to relieve pain and swelling. Knee extension, prone  Lie on your abdomen (prone position) on a bed. Place your left / right knee just beyond the edge of the surface so your knee is not on the bed. You can put a towel under your left / right thigh just above your kneecap for comfort. Relax your leg muscles and allow gravity to straighten your knee (extension). You should feel a stretch behind your left / right knee. Hold this position for __________ seconds. Scoot up so your knee is supported between repetitions. Repeat __________ times. Complete this exercise __________ times a day. Knee flexion, active  Lie on your back with both legs straight. If this causes back discomfort, bend your left / right knee so your foot is flat on the floor. Slowly slide your left / right heel back toward your buttocks. Stop when you feel a gentle stretch in the front of your knee or thigh (flexion). Hold this position for __________ seconds. Slowly slide your left / right heel back to the starting position. Repeat __________ times. Complete this exercise __________ times a day. Quadriceps  stretch, prone  Lie on your abdomen on a firm surface, such as a bed or padded floor. Bend your left / right knee and hold your ankle. If you cannot reach your ankle or pant leg, loop a belt around your foot and grab the belt instead. Gently pull your heel toward your buttocks. Your knee should not slide out to the side. You should feel a stretch in the front of your thigh and knee (quadriceps). Hold this position for __________ seconds. Repeat  __________ times. Complete this exercise __________ times a day. Hamstring, supine  Lie on your back (supine position). Loop a belt or towel over the ball of your left / right foot. The ball of your foot is on the walking surface, right under your toes. Straighten your left / right knee and slowly pull on the belt to raise your leg until you feel a gentle stretch behind your knee (hamstring). Do not let your knee bend while you do this. Keep your other leg flat on the floor. Hold this position for __________ seconds. Repeat __________ times. Complete this exercise __________ times a day. Strengthening exercises These exercises build strength and endurance in your knee. Endurance is the ability to use your muscles for a long time, even after they get tired. Quadriceps, isometric This exercise strengthens the muscles in front of your thigh (quadriceps) without moving your knee joint (isometric). Lie on your back with your left / right leg extended and your other knee bent. Put a rolled towel or small pillow under your knee if told by your health care provider. Slowly tense the muscles in the front of your left / right thigh. You should see your kneecap slide up toward your hip or see increased dimpling just above the knee. This motion will push the back of the knee toward the floor. For __________ seconds, hold the muscle as tight as you can without increasing your pain. Relax the muscles slowly and completely. Repeat __________ times. Complete this exercise __________ times a day. Straight leg raises This exercise strengthens the muscles in front of your thigh (quadriceps) and the muscles that move your hips (hip flexors). Lie on your back with your left / right leg extended and your other knee bent. Tense the muscles in the front of your left / right thigh. You should see your kneecap slide up or see increased dimpling just above the knee. Your thigh may even shake a bit. Keep these muscles  tight as you raise your leg 4-6 inches (10-15 cm) off the floor. Do not let your knee bend. Hold this position for __________ seconds. Keep these muscles tense as you lower your leg. Relax your muscles slowly and completely after each repetition. Repeat __________ times. Complete this exercise __________ times a day. Hamstring, isometric  Lie on your back on a firm surface. Bend your left / right knee about __________ degrees. Dig your left / right heel into the surface as if you are trying to pull it toward your buttocks. Tighten the muscles in the back of your thighs (hamstring) to "dig" as hard as you can without increasing any pain. Hold this position for __________ seconds. Release the tension gradually and allow your muscles to relax completely for __________ seconds after each repetition. Repeat __________ times. Complete this exercise __________ times a day. Hamstring curls If told by your health care provider, do this exercise while wearing ankle weights. Begin with __________lb / kg weights. Then increase the weight by 1 lb (0.5  kg) increments. Do not wear ankle weights that are more than __________lb / kg. Lie on your abdomen with your legs straight. Bend your left / right knee as far as you can without feeling pain. Keep your hips flat against the floor. Hold this position for __________ seconds. Slowly lower your leg to the starting position. Repeat __________ times. Complete this exercise __________ times a day. Squats This exercise strengthens the muscles in front of your thigh and knee (quadriceps). Stand in front of a table, with your feet and knees pointing straight ahead. You may rest your hands on the table for balance but not for support. Slowly bend your knees and lower your hips like you are going to sit in a chair. Keep your weight over your heels, not over your toes. Keep your lower legs upright so they are parallel with the table legs. Do not let your hips go lower  than your knees. Do not bend lower than told by your health care provider. If your knee pain increases, do not bend as low. Hold the squat position for __________ seconds. Slowly push with your legs to return to standing. Do not use your hands to pull yourself to standing. Repeat __________ times. Complete this exercise __________ times a day. Wall slides This exercise strengthens the muscles in front of your thigh and knee (quadriceps). Lean your back against a smooth wall or door, and walk your feet out 18-24 inches (46-61 cm) from it. Place your feet hip-width apart. Slowly slide down the wall or door until your knees bend __________ degrees. Keep your knees over your heels, not over your toes. Keep your knees in line with your hips. Hold this position for __________ seconds. Repeat __________ times. Complete this exercise __________ times a day. Straight leg raises, side-lying This exercise strengthens the muscles that rotate the leg at the hip and move it away from your body (hip abductors). Lie on your side with your left / right leg in the top position. Lie so your head, shoulder, knee, and hip line up. You may bend your bottom knee to help you keep your balance. Roll your hips slightly forward so your hips are stacked directly over each other and your left / right knee is facing forward. Leading with your heel, lift your top leg 4-6 inches (10-15 cm). You should feel the muscles in your outer hip lifting. Do not let your foot drift forward. Do not let your knee roll toward the ceiling. Hold this position for __________ seconds. Slowly return your leg to the starting position. Let your muscles relax completely after each repetition. Repeat __________ times. Complete this exercise __________ times a day. Straight leg raises, prone This exercise stretches the muscles that move your hips away from the front of the pelvis (hip extensors). Lie on your abdomen on a firm surface. You can  put a pillow under your hips if that is more comfortable. Tense the muscles in your buttocks and lift your left / right leg about 4-6 inches (10-15 cm). Keep your knee straight as you lift your leg. Hold this position for __________ seconds. Slowly lower your leg to the starting position. Let your leg relax completely after each repetition. Repeat __________ times. Complete this exercise __________ times a day. This information is not intended to replace advice given to you by your health care provider. Make sure you discuss any questions you have with your health care provider. Document Revised: 06/28/2021 Document Reviewed: 06/28/2021 Elsevier Patient Education  2023 [employer] Exercises for Chronic Knee Pain Chronic knee pain is pain that lasts longer than 3 months. For most people with chronic knee pain, exercise and weight loss is an important part of treatment. Your health care provider may want you to focus on: Strengthening the muscles that support your knee. This can take pressure off your knee and lessen pain. Preventing knee stiffness. Maintaining or increasing how far you can move your knee. Losing weight (if this applies) to take pressure off your knee, decrease your risk for injury, and make it easier for you to exercise. Your health care provider will help you develop an exercise program that matches your needs and physical abilities. Below are simple, low-impact exercises you can do at home. Ask your health care provider or a physical therapist how often you should do your exercise program and how many times to repeat each exercise. General safety tips Follow these safety tips for exercising with chronic knee pain: Get your health care provider's approval before doing any exercises. Start slowly and stop any time an exercise causes pain. Do not exercise if your knee pain is flaring up. Warm up first. Stretching a cold muscle can cause an injury. Do 5-10 minutes of easy  movement or light stretching before beginning your exercise routine. Do 5-10 minutes of low-impact activity (like walking or cycling) before starting strengthening exercises. Contact your health care provider any time you have pain during or after exercising. Exercise may cause discomfort but should not be painful. It is normal to be a little stiff or sore after exercising.  Stretching and range-of-motion exercises Front thigh stretch  Stand up straight and support your body by holding on to a chair or resting one hand on a wall. With your legs straight and close together, bend one knee to lift your heel up toward your buttocks. Using one hand for support, grab your ankle with your free hand. Pull your foot up closer toward your buttocks to feel the stretch in front of your thigh. Hold the stretch for 30 seconds. Repeat __________ times. Complete this exercise __________ times a day. Back thigh stretch  Sit on the floor with your back straight and your legs out straight in front of you. Place the palms of your hands on the floor and slide them toward your feet as you bend at the hip. Try to touch your nose to your knees and feel the stretch in the back of your thighs. Hold for 30 seconds. Repeat __________ times. Complete this exercise __________ times a day. Calf stretch  Stand facing a wall. Place the palms of your hands flat against the wall, arms extended, and lean slightly against the wall. Get into a lunge position with one leg bent at the knee and the other leg stretched out straight behind you. Keep both feet facing the wall and increase the bend in your knee while keeping the heel of the other leg flat on the ground. You should feel the stretch in your calf. Hold for 30 seconds. Repeat __________ times. Complete this exercise __________ times a day. Strengthening exercises Straight leg lift Lie on your back with one knee bent and the other leg out straight. Slowly lift the  straight leg without bending the knee. Lift until your foot is about 12 inches (30 cm) off the floor. Hold for 3-5 seconds and slowly lower your leg. Repeat __________ times. Complete this exercise __________ times a day. Single leg dip Stand between two chairs and put  both hands on the backs of the chairs for support. Extend one leg out straight with your body weight resting on the heel of the standing leg. Slowly bend your standing knee to dip your body to the level that is comfortable for you. Hold for 3-5 seconds. Repeat __________ times. Complete this exercise __________ times a day. Hamstring curls Stand straight, knees close together, facing the back of a chair. Hold on to the back of a chair with both hands. Keep one leg straight. Bend the other knee while bringing the heel up toward the buttock until the knee is bent at a 90-degree angle (right angle). Hold for 3-5 seconds. Repeat __________ times. Complete this exercise __________ times a day. Wall squat Stand straight with your back, hips, and head against a wall. Step forward one foot at a time with your back still against the wall. Your feet should be 2 feet (61 cm) from the wall at shoulder width. Keeping your back, hips, and head against the wall, slide down the wall to as close of a sitting position as you can get. Hold for 5-10 seconds, then slowly slide back up. Repeat __________ times. Complete this exercise __________ times a day. Step-ups Step up with one foot onto a sturdy platform or stool that is about 6 inches (15 cm) high. Face sideways with one foot on the platform and one on the ground. Place all your weight on the platform foot and lift your body off the ground until your knee extends. Let your other leg hang free to the side. Hold for 3-5 seconds then slowly lower your weight down to the floor foot. Repeat __________ times. Complete this exercise __________ times a day. Contact a health care provider  if: Your exercise causes pain. Your pain is worse after you exercise. Your pain prevents you from doing your exercises. This information is not intended to replace advice given to you by your health care provider. Make sure you discuss any questions you have with your health care provider. Document Revised: 02/19/2020 Document Reviewed: 10/13/2019 Elsevier Patient Education  2023 [employer].

## 2022-04-04 NOTE — Progress Notes (Signed)
CBC WNL.  ALT remains slightly elevated-62. AST WNL.  Rest of CMP WNL.  Patient should try to avoid tylenol, NSAID, and alcohol use.

## 2022-04-06 LAB — COMPLETE METABOLIC PANEL WITH GFR
AG Ratio: 1.6 (calc) (ref 1.0–2.5)
ALT: 62 U/L — ABNORMAL HIGH (ref 9–46)
AST: 25 U/L (ref 10–35)
Albumin: 4.5 g/dL (ref 3.6–5.1)
Alkaline phosphatase (APISO): 53 U/L (ref 35–144)
BUN: 22 mg/dL (ref 7–25)
CO2: 26 mmol/L (ref 20–32)
Calcium: 10 mg/dL (ref 8.6–10.3)
Chloride: 106 mmol/L (ref 98–110)
Creat: 1.14 mg/dL (ref 0.70–1.30)
Globulin: 2.8 g/dL (calc) (ref 1.9–3.7)
Glucose, Bld: 80 mg/dL (ref 65–99)
Potassium: 4.5 mmol/L (ref 3.5–5.3)
Sodium: 140 mmol/L (ref 135–146)
Total Bilirubin: 0.5 mg/dL (ref 0.2–1.2)
Total Protein: 7.3 g/dL (ref 6.1–8.1)
eGFR: 78 mL/min/{1.73_m2} (ref >=60)

## 2022-04-06 LAB — CBC WITH DIFFERENTIAL/PLATELET
Absolute Monocytes: 510 cells/uL (ref 200–950)
Basophils Absolute: 57 cells/uL (ref 0–200)
Basophils Relative: 0.9 %
Eosinophils Absolute: 416 cells/uL (ref 15–500)
Eosinophils Relative: 6.6 %
HCT: 49.4 % (ref 38.5–50.0)
Hemoglobin: 16 g/dL (ref 13.2–17.1)
Lymphs Abs: 2394 cells/uL (ref 850–3900)
MCH: 28 pg (ref 27.0–33.0)
MCHC: 32.4 g/dL (ref 32.0–36.0)
MCV: 86.5 fL (ref 80.0–100.0)
MPV: 11.4 fL (ref 7.5–12.5)
Monocytes Relative: 8.1 %
Neutro Abs: 2923 cells/uL (ref 1500–7800)
Neutrophils Relative %: 46.4 %
Platelets: 195 10*3/uL (ref 140–400)
RBC: 5.71 10*6/uL (ref 4.20–5.80)
RDW: 12.7 % (ref 11.0–15.0)
Total Lymphocyte: 38 %
WBC: 6.3 10*3/uL (ref 3.8–10.8)

## 2022-04-06 LAB — QUANTIFERON-TB GOLD PLUS
Mitogen-NIL: >10 IU/mL
NIL: 0.04 IU/mL
QuantiFERON-TB Gold Plus: NEGATIVE
TB1-NIL: 0.02 IU/mL
TB2-NIL: 0.03 IU/mL

## 2022-04-06 NOTE — Progress Notes (Signed)
TB Gold is negative.

## 2022-04-21 ENCOUNTER — Telehealth: Payer: Self-pay | Admitting: Rheumatology

## 2022-04-21 ENCOUNTER — Telehealth: Payer: Self-pay | Admitting: *Deleted

## 2022-04-24 ENCOUNTER — Telehealth: Payer: Self-pay | Admitting: Pharmacist

## 2022-05-04 NOTE — Telephone Encounter (Signed)
Received a fax from  [employer] regarding an approval for ENBREL patient assistance from 04/28/22 to 03/3023.   Phone number: [phone]  Chesley Mires, PharmD, MPH, BCPS, CPP Clinical Pharmacist (Rheumatology and Pulmonology)

## 2022-08-01 ENCOUNTER — Telehealth: Payer: Self-pay | Admitting: Rheumatology

## 2022-08-01 NOTE — Telephone Encounter (Signed)
Spoke with Herbert Spires, pharmacist with [facility] and gave verbal authorization to replace Enbrel Auto touch. They will mail out to patient.  Left message to advise patient.

## 2022-08-01 NOTE — Telephone Encounter (Signed)
Patient called the office stating his Enbrel injector battery died. Patient states he reached out to [employer] to get a new one and they require a new prescription to send him a replacement.

## 2022-09-20 NOTE — Progress Notes (Signed)
Office Visit Note  Patient: John Blackwell             Date of Birth: [birth year]           MRN: [id number]             PCP: Patient, No Pcp Per Referring: No ref. provider found Visit Date: 10/04/2022 Occupation: @GUAROCC @  Subjective:  Medication management  History of Present Illness: John Blackwell is a 51 y.o. male with history of rheumatoid arthritis and osteoarthritis.  He states he developed upper respiratory tract infection about 3 weeks ago.  He skipped a dose of Enbrel after that he had mild flare in his left shoulder which eventually resolved after resuming handrolled.  He continues to have some discomfort in his knee joints which he relates to climbing the stairs.  He has not noticed any joint swelling.  He has been taking Enbrel 50 mg subcu weekly without any side effects.  He is trying intentional weight loss.  Activities of Daily Living:  Patient reports morning stiffness for 15 minutes.   Patient Denies nocturnal pain.  Difficulty dressing/grooming: Denies Difficulty climbing stairs: Denies Difficulty getting out of chair: Denies Difficulty using hands for taps, buttons, cutlery, and/or writing: Denies  Review of Systems  Constitutional:  Positive for fatigue.  HENT:  Negative for mouth sores and mouth dryness.   Eyes:  Negative for dryness.  Respiratory:  Negative for shortness of breath.   Cardiovascular:  Negative for chest pain and palpitations.  Gastrointestinal:  Negative for blood in stool, constipation and diarrhea.  Endocrine: Negative for increased urination.  Genitourinary:  Negative for involuntary urination.  Musculoskeletal:  Positive for joint pain, joint pain, joint swelling, myalgias, morning stiffness, muscle tenderness and myalgias. Negative for gait problem and muscle weakness.  Skin:  Negative for color change, rash, hair loss and sensitivity to sunlight.  Allergic/Immunologic: Negative for susceptible to infections.  Neurological:   Negative for dizziness and headaches.  Hematological:  Negative for swollen glands.  Psychiatric/Behavioral:  Negative for depressed mood and sleep disturbance. The patient is not nervous/anxious.     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   Alcohol: 3-5 Beers each week day.                Friday Nights: 12 pack                Saturday: 12 pack  Sundays: None   Says that in past drank more. And also drank liquor in past but rarely drinks any liquor now.      Never smoked.    Single.    Has Girlfriend.   Has lost 25 lbs in past year secondary to diet changes-stopped eating late night.(entered 07/2013)   Was lifting weights until past 2 months-stopped sec to pain.   Immunization History  Administered Date(s) Administered   Tdap 01/31/2016     Objective: Vital Signs: BP 130/87 (BP Location: Left Arm, Patient Position: Sitting, Cuff Size: Normal)   Pulse 89   Resp 18   Ht 5\' 8"  (1.727 m)   Wt 245 lb 9.6 oz (111.4 kg)   BMI 37.34 kg/m    Physical Exam Vitals  and nursing note reviewed.  Constitutional:      Appearance: He is well-developed.  HENT:     Head: Normocephalic and atraumatic.  Eyes:     Conjunctiva/sclera: Conjunctivae normal.     Pupils: Pupils are equal, round, and reactive to light.  Cardiovascular:     Rate and Rhythm: Normal rate and regular rhythm.     Heart sounds: Normal heart sounds.  Pulmonary:     Effort: Pulmonary effort is normal.     Breath sounds: Normal breath sounds.  Abdominal:     General: Bowel sounds are normal.     Palpations: Abdomen is soft.  Musculoskeletal:     Cervical back: Normal range of motion and neck supple.  Skin:    General: Skin is warm and dry.     Capillary Refill: Capillary refill takes less than 2 seconds.  Neurological:     Mental Status: He is alert and oriented to person, place, and time.  Psychiatric:        Behavior: Behavior normal.      Musculoskeletal Exam: Apical spine was in good range of motion.  Shoulders, elbows, wrist joints were in good range of motion with no synovitis.  He had bilateral PIP and DIP thickening with no synovitis.  Hip joints and knee joints in good range of motion without any warmth swelling or effusion.  There was no tenderness over ankles or MTPs.  CDAI Exam: CDAI Score: 2.2  Patient Global: 1 mm; Provider Global: 1 mm Swollen: 0 ; Tender: 2  Joint Exam 10/04/2022      Right  Left  Knee   Tender   Tender     Investigation: No additional findings.  Imaging: No results found.  Recent Labs: Lab Results  Component Value Date   WBC 6.3 04/03/2022   HGB 16.0 04/03/2022   PLT 195 04/03/2022   NA 140 04/03/2022   K 4.5 04/03/2022   CL 106 04/03/2022   CO2 26 04/03/2022   GLUCOSE 80 04/03/2022   BUN 22 04/03/2022   CREATININE 1.14 04/03/2022   BILITOT 0.5 04/03/2022   ALKPHOS 58 08/27/2020   AST 25 04/03/2022   ALT 62 (H) 04/03/2022   PROT 7.3 04/03/2022   ALBUMIN 4.8 08/27/2020   CALCIUM 10.0 04/03/2022   GFRAA 108 12/22/2020    QFTBGOLDPLUS NEGATIVE 04/03/2022    Speciality Comments: No specialty comments available.  Procedures:  No procedures performed Allergies: Patient has no known allergies.   Assessment / Plan:     Visit Diagnoses: Rheumatoid arthritis with rheumatoid factor of multiple sites without organ or systems involvement (HCC) - RF+, CCP+: Patient has been doing well on Enbrel monotherapy.  He had interruption in the  treatment due to upper respiratory tract infection with some discomfort in his left shoulder.  This and discomfort resolved after resuming Enbrel.  He denies any history of joint swelling.  High risk medication use - Enbrel 50 mg sq injections once weekly. D/c MTX due to elevated LFTs. He takes tylenol very sparingly although he consumes alcohol.  Abstinence from alcohol was discussed.  Labs obtained in June were reviewed which showed normal CBC, CMP showed elevated ALT of 62 which has been elevated for several years.  TB gold was negative in June 2023.  He will get labs today and then every 3 months.- Plan: CBC with Differential/Platelet, COMPLETE METABOLIC PANEL WITH GFR.  Information regarding immunization was placed in the AVS.  He was also advised to hold Enbrel if he develops an infection and resume after the infection resolves.  Annual skin examination to screen for skin cancer was also advised.  Use of sunscreen was advised.  Elevated LFTs -he continues to have elevated LFTs.  He consumes alcohol.  Abstinence from alcohol was discussed.  Weight loss diet and exercise was also advised.  He was evaluated by Dr. Myrtie Neither on 08/27/2020 for persistently elevated LFTs.  He underwent an abdominal ultrasound on 09/03/2020.  Admits to alcohol consumption  Primary osteoarthritis of both hands-he is some stiffness in his hands.  He has osteoarthritis in his hands.  Joint protection muscle strengthening was discussed.  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   I offered obtaining x-rays of the knee joints but he declined.  I handout on lower extremity muscle strengthening exercises was given.  Weight loss diet and exercise was also emphasized.  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  Elevated blood pressure reading-blood pressure was elevated at 148/93 today.  Repeat blood pressure was 130/87.  He was advised to monitor blood pressure closely and follow-up with the PCP if blood pressure stays elevated.  BMI 37.34-weight loss diet and exercise was emphasized.  A handout on dietary modifications were placed in the AVS.  I also offered referral to weight management clinic but he would like to try at his own..  Orders: Orders Placed This Encounter  Procedures   CBC with Differential/Platelet   COMPLETE METABOLIC PANEL WITH GFR   No orders of the defined types were placed in this encounter.    Follow-Up Instructions: Return in about 5 months (around 03/05/2023) for Rheumatoid arthritis, Osteoarthritis.   Pollyann Savoy, MD  Note - This record has been created using Animal nutritionist.  Chart creation errors have been sought, but may not always  have been located. Such creation errors do not reflect on  the standard of medical care.

## 2022-10-04 ENCOUNTER — Encounter: Payer: Self-pay | Admitting: Rheumatology

## 2022-10-04 ENCOUNTER — Ambulatory Visit: Payer: Self-pay | Attending: Rheumatology | Admitting: Rheumatology

## 2022-10-04 VITALS — BP 130/87 | HR 89 | Resp 18 | Ht 68.0 in | Wt 245.6 lb

## 2022-10-04 DIAGNOSIS — M25561 Pain in right knee: Secondary | ICD-10-CM

## 2022-10-04 DIAGNOSIS — M25562 Pain in left knee: Secondary | ICD-10-CM

## 2022-10-04 DIAGNOSIS — M19041 Primary osteoarthritis, right hand: Secondary | ICD-10-CM

## 2022-10-04 DIAGNOSIS — R030 Elevated blood-pressure reading, without diagnosis of hypertension: Secondary | ICD-10-CM

## 2022-10-04 DIAGNOSIS — Z79899 Other long term (current) drug therapy: Secondary | ICD-10-CM

## 2022-10-04 DIAGNOSIS — A6002 Herpesviral infection of other male genital organs: Secondary | ICD-10-CM

## 2022-10-04 DIAGNOSIS — M0579 Rheumatoid arthritis with rheumatoid factor of multiple sites without organ or systems involvement: Secondary | ICD-10-CM

## 2022-10-04 DIAGNOSIS — Z789 Other specified health status: Secondary | ICD-10-CM

## 2022-10-04 DIAGNOSIS — M19042 Primary osteoarthritis, left hand: Secondary | ICD-10-CM

## 2022-10-04 DIAGNOSIS — G8929 Other chronic pain: Secondary | ICD-10-CM

## 2022-10-04 DIAGNOSIS — R7989 Other specified abnormal findings of blood chemistry: Secondary | ICD-10-CM

## 2022-10-04 DIAGNOSIS — Z6837 Body mass index (BMI) 37.0-37.9, adult: Secondary | ICD-10-CM

## 2022-10-04 NOTE — Patient Instructions (Addendum)
Standing Labs We placed an order today for your standing lab work.   Please have your standing labs drawn in March and every 3 months  Please have your labs drawn 2 weeks prior to your appointment so that the provider can discuss your lab results at your appointment.  Please note that you may see your imaging and lab results in [facility] before we have reviewed them. We will contact you once all results are reviewed. Please allow our office up to 72 hours to thoroughly review all of the results before contacting the office for clarification of your results.  Lab hours are:   Monday through Thursday from 8:00 am -12:30 pm and 1:00 pm-5:00 pm and Friday from 8:00 am-12:00 pm.  Please be advised, all patients with office appointments requiring lab work will take precedent over walk-in lab work.   Labs are drawn by [facility]. Please bring your co-pay at the time of your lab draw.  You may receive a bill from [facility] for your lab work.  Please note if you are on Hydroxychloroquine and and an order has been placed for a Hydroxychloroquine level, you will need to have it drawn 4 hours or more after your last dose.  If you wish to have your labs drawn at another location, please call the office 24 hours in advance so we can fax the orders.  The office is located at [address] [place], [place], [place] [address] No appointment is necessary.    If you have any questions regarding directions or hours of operation,  please call [phone].   As a reminder, please drink plenty of water prior to coming for your lab work. Thanks!   Vaccines You are taking a medication(s) that can suppress your immune system.  The following immunizations are recommended: Flu annually Covid-19  RSV Td/Tdap (tetanus, diphtheria, pertussis) every 10 years Pneumonia (Prevnar 15 then Pneumovax 23 at least 1 year apart.  Alternatively, can take Prevnar 20 without needing additional dose) Shingrix: 2 doses from 4  weeks to 6 months apart  Please check with your PCP to make sure you are up to date.   If you have signs or symptoms of an infection or start antibiotics: First, call your PCP for workup of your infection. Hold your medication through the infection, until you complete your antibiotics, and until symptoms resolve if you take the following: Injectable medication (Actemra, Benlysta, Cimzia, Cosentyx, Enbrel, Humira, Kevzara, Orencia, Remicade, Simponi, Stelara, Taltz, Tremfya) Methotrexate Leflunomide (Arava) Mycophenolate (Cellcept) Morrie Sheldon, Olumiant, or Rinvoq   Please get an annual skin examination to screen for skin cancer while you are on Enbrel.  Exercises for Chronic Knee Pain Chronic knee pain is pain that lasts longer than 3 months. For most people with chronic knee pain, exercise and weight loss is an important part of treatment. Your health care provider may want you to focus on: Strengthening the muscles that support your knee. This can take pressure off your knee and lessen pain. Preventing knee stiffness. Maintaining or increasing how far you can move your knee. Losing weight (if this applies) to take pressure off your knee, decrease your risk for injury, and make it easier for you to exercise. Your health care provider will help you develop an exercise program that matches your needs and physical abilities. Below are simple, low-impact exercises you can do at home. Ask your health care provider or a physical therapist how often you should do your exercise program and how many times to  repeat each exercise. General safety tips Follow these safety tips for exercising with chronic knee pain: Get your health care provider's approval before doing any exercises. Start slowly and stop any time an exercise causes pain. Do not exercise if your knee pain is flaring up. Warm up first. Stretching a cold muscle can cause an injury. Do 5-10 minutes of easy movement or light stretching before  beginning your exercise routine. Do 5-10 minutes of low-impact activity (like walking or cycling) before starting strengthening exercises. Contact your health care provider any time you have pain during or after exercising. Exercise may cause discomfort but should not be painful. It is normal to be a little stiff or sore after exercising.  Stretching and range-of-motion exercises Front thigh stretch  Stand up straight and support your body by holding on to a chair or resting one hand on a wall. With your legs straight and close together, bend one knee to lift your heel up toward your buttocks. Using one hand for support, grab your ankle with your free hand. Pull your foot up closer toward your buttocks to feel the stretch in front of your thigh. Hold the stretch for 30 seconds. Repeat __________ times. Complete this exercise __________ times a day. Back thigh stretch  Sit on the floor with your back straight and your legs out straight in front of you. Place the palms of your hands on the floor and slide them toward your feet as you bend at the hip. Try to touch your nose to your knees and feel the stretch in the back of your thighs. Hold for 30 seconds. Repeat __________ times. Complete this exercise __________ times a day. Calf stretch  Stand facing a wall. Place the palms of your hands flat against the wall, arms extended, and lean slightly against the wall. Get into a lunge position with one leg bent at the knee and the other leg stretched out straight behind you. Keep both feet facing the wall and increase the bend in your knee while keeping the heel of the other leg flat on the ground. You should feel the stretch in your calf. Hold for 30 seconds. Repeat __________ times. Complete this exercise __________ times a day. Strengthening exercises Straight leg lift Lie on your back with one knee bent and the other leg out straight. Slowly lift the straight leg without bending the  knee. Lift until your foot is about 12 inches (30 cm) off the floor. Hold for 3-5 seconds and slowly lower your leg. Repeat __________ times. Complete this exercise __________ times a day. Single leg dip Stand between two chairs and put both hands on the backs of the chairs for support. Extend one leg out straight with your body weight resting on the heel of the standing leg. Slowly bend your standing knee to dip your body to the level that is comfortable for you. Hold for 3-5 seconds. Repeat __________ times. Complete this exercise __________ times a day. Hamstring curls Stand straight, knees close together, facing the back of a chair. Hold on to the back of a chair with both hands. Keep one leg straight. Bend the other knee while bringing the heel up toward the buttock until the knee is bent at a 90-degree angle (right angle). Hold for 3-5 seconds. Repeat __________ times. Complete this exercise __________ times a day. Wall squat Stand straight with your back, hips, and head against a wall. Step forward one foot at a time with your back still against the wall.  Your feet should be 2 feet (61 cm) from the wall at shoulder width. Keeping your back, hips, and head against the wall, slide down the wall to as close of a sitting position as you can get. Hold for 5-10 seconds, then slowly slide back up. Repeat __________ times. Complete this exercise __________ times a day. Step-ups Step up with one foot onto a sturdy platform or stool that is about 6 inches (15 cm) high. Face sideways with one foot on the platform and one on the ground. Place all your weight on the platform foot and lift your body off the ground until your knee extends. Let your other leg hang free to the side. Hold for 3-5 seconds then slowly lower your weight down to the floor foot. Repeat __________ times. Complete this exercise __________ times a day. Contact a health care provider if: Your exercise causes pain. Your  pain is worse after you exercise. Your pain prevents you from doing your exercises. This information is not intended to replace advice given to you by your health care provider. Make sure you discuss any questions you have with your health care provider. Document Revised: 02/19/2020 Document Reviewed: 10/13/2019 Elsevier Patient Education  2023 [employer]  Heart Disease Prevention   Your inflammatory disease increases your risk of heart disease which includes heart attack, stroke, atrial fibrillation (irregular heartbeats), high blood pressure, heart failure and atherosclerosis (plaque in the arteries).  It is important to reduce your risk by:   Keep blood pressure, cholesterol, and blood sugar at healthy levels   Smoking Cessation   Maintain a healthy weight  BMI 20-25   Eat a healthy diet  Plenty of fresh fruit, vegetables, and whole grains  Limit saturated fats, foods high in sodium, and added sugars  DASH and Mediterranean diet   Increase physical activity  Recommend moderate physically activity for 150 minutes per week/ 30 minutes a day for five days a week These can be broken up into three separate ten-minute sessions during the day.   Reduce Stress  Meditation, slow breathing exercises, yoga, coloring books  Dental visits twice a year

## 2022-10-05 LAB — CBC WITH DIFFERENTIAL/PLATELET
Absolute Monocytes: 462 cells/uL (ref 200–950)
Basophils Absolute: 48 cells/uL (ref 0–200)
Basophils Relative: 0.7 %
Eosinophils Absolute: 340 cells/uL (ref 15–500)
Eosinophils Relative: 5 %
HCT: 45.9 % (ref 38.5–50.0)
Hemoglobin: 15.7 g/dL (ref 13.2–17.1)
Lymphs Abs: 2237 cells/uL (ref 850–3900)
MCH: 29.5 pg (ref 27.0–33.0)
MCHC: 34.2 g/dL (ref 32.0–36.0)
MCV: 86.3 fL (ref 80.0–100.0)
MPV: 12.4 fL (ref 7.5–12.5)
Monocytes Relative: 6.8 %
Neutro Abs: 3713 cells/uL (ref 1500–7800)
Neutrophils Relative %: 54.6 %
Platelets: 239 10*3/uL (ref 140–400)
RBC: 5.32 10*6/uL (ref 4.20–5.80)
RDW: 12.3 % (ref 11.0–15.0)
Total Lymphocyte: 32.9 %
WBC: 6.8 10*3/uL (ref 3.8–10.8)

## 2022-10-05 LAB — COMPLETE METABOLIC PANEL WITH GFR
AG Ratio: 1.7 (calc) (ref 1.0–2.5)
ALT: 55 U/L — ABNORMAL HIGH (ref 9–46)
AST: 28 U/L (ref 10–35)
Albumin: 4.7 g/dL (ref 3.6–5.1)
Alkaline phosphatase (APISO): 65 U/L (ref 35–144)
BUN: 21 mg/dL (ref 7–25)
CO2: 24 mmol/L (ref 20–32)
Calcium: 9.7 mg/dL (ref 8.6–10.3)
Chloride: 103 mmol/L (ref 98–110)
Creat: 1.09 mg/dL (ref 0.70–1.30)
Globulin: 2.8 g/dL (calc) (ref 1.9–3.7)
Glucose, Bld: 110 mg/dL — ABNORMAL HIGH (ref 65–99)
Potassium: 4.7 mmol/L (ref 3.5–5.3)
Sodium: 139 mmol/L (ref 135–146)
Total Bilirubin: 0.5 mg/dL (ref 0.2–1.2)
Total Protein: 7.5 g/dL (ref 6.1–8.1)
eGFR: 82 mL/min/{1.73_m2} (ref >=60)

## 2022-10-05 NOTE — Progress Notes (Signed)
Glucose is mildly elevated probably not a fasting sample.  Liver function is mildly elevated and stable.  Please forward results to his PCP.

## 2022-10-09 ENCOUNTER — Other Ambulatory Visit: Payer: Self-pay | Admitting: Rheumatology

## 2022-10-09 MED ORDER — ENBREL MINI 50 MG/ML ~~LOC~~ SOCT: 50.0000 mg | SUBCUTANEOUS | 0 refills | Status: DC

## 2022-10-09 NOTE — Telephone Encounter (Signed)
Next Visit: 03/06/2023  Last Visit: 10/04/2022  Last Fill: 11/09/2021  BF:XOVANVBTYO arthritis with rheumatoid factor of multiple sites without organ or systems involvement   Current Dose per office note on 10/04/2022: Enbrel 50 mg sq injections once weekly   Labs: 10/04/2022 Glucose is mildly elevated probably not a fasting sample.  Liver function is mildly elevated and stable.   TB Gold: 04/03/2022 negative    Okay to refill enbrel?

## 2022-10-09 NOTE — Telephone Encounter (Signed)
Patient called the office requesting a refill of Enbrel be sent to [facility]. Patient states the pharmacy should be reaching out soon for the refill as well.

## 2022-10-11 ENCOUNTER — Telehealth: Payer: Self-pay | Admitting: Rheumatology

## 2022-10-11 NOTE — Telephone Encounter (Signed)
Patient called the office stating he called [facility] this morning and they have not received a prescription yet for his Enbrel. Patient requests it be resent.

## 2022-10-12 NOTE — Telephone Encounter (Signed)
Spoke with [facility] who confirmed they have received the prescription for Enbrel. It has been sent to [facility] to process. Left message to advise patient to call tomorrow to set up shipment.

## 2023-02-20 NOTE — Progress Notes (Signed)
Office Visit Note  Patient: John Blackwell             Date of Birth: Feb 21, 1971           MRN: [id number]             PCP: Patient, No Pcp Per Referring: No ref. provider found Visit Date: 03/06/2023 Occupation: @GUAROCC @  Subjective:  Medication monitoring   History of Present Illness: John Blackwell is a 52 y.o. male with history of seropositive rheumatoid arthritis and osteoarthritis.  Patient remains on Enbrel 50 mg sq injections once weekly.  He is tolerating Enbrel without any side effects or injection site reactions.  He has not missed any doses of Enbrel recently.  He has not had any recent or recurrent infections.  He denies any signs or symptoms of a rheumatoid arthritis flare recently.  He states that his knee joint pain has been improving.  He experiences some discomfort and stiffness in the evenings after working for long periods of time but denies any joint swelling or warmth.  He plans on trying to work on weight loss. He denies any new medical conditions.   Activities of Daily Living:  Patient reports morning stiffness for 15 minutes.   Patient Reports nocturnal pain.  Difficulty dressing/grooming: Denies Difficulty climbing stairs: Denies Difficulty getting out of chair: Denies Difficulty using hands for taps, buttons, cutlery, and/or writing: Denies  Review of Systems  Constitutional:  Positive for fatigue.  HENT:  Negative for mouth sores and mouth dryness.   Eyes:  Negative for dryness.  Respiratory:  Negative for shortness of breath.   Cardiovascular:  Negative for chest pain and palpitations.  Gastrointestinal:  Negative for blood in stool, constipation and diarrhea.  Endocrine: Negative for increased urination.  Genitourinary:  Negative for involuntary urination.  Musculoskeletal:  Positive for joint pain, joint pain, myalgias, muscle weakness, morning stiffness, muscle tenderness and myalgias. Negative for gait problem and joint swelling.  Skin:   Negative for color change, rash, hair loss and sensitivity to sunlight.  Allergic/Immunologic: Negative for susceptible to infections.  Neurological:  Negative for dizziness and headaches.  Hematological:  Negative for swollen glands.  Psychiatric/Behavioral:  Negative for depressed mood and sleep disturbance. The patient is not nervous/anxious.     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 Climbs Ladders.   Alcohol: 3-5 Beers each week day.                Friday Nights: 12 pack  Saturday: 12 pack                Sundays: None   Says that in past drank more. And also drank liquor in past but rarely drinks any liquor now.      Never smoked.    Single.    Has Girlfriend.   Has lost 25 lbs in past year secondary to diet changes-stopped eating late night.(entered 07/2013)   Was lifting weights until past 2 months-stopped sec to pain.   Immunization History  Administered Date(s) Administered   Tdap 01/31/2016     Objective: Vital Signs: BP 137/87 (BP Location: Left Arm, Patient  Position: Sitting, Cuff Size: Large)   Pulse 65   Resp 17   Ht 5\' 8"  (1.727 m)   Wt 245 lb 12.8 oz (111.5 kg)   BMI 37.37 kg/m    Physical Exam Vitals and nursing note reviewed.  Constitutional:      Appearance: He is well-developed.  HENT:     Head: Normocephalic and atraumatic.  Eyes:     Conjunctiva/sclera: Conjunctivae normal.     Pupils: Pupils are equal, round, and reactive to light.  Cardiovascular:     Rate and Rhythm: Normal rate and regular rhythm.     Heart sounds: Normal heart sounds.  Pulmonary:     Effort: Pulmonary effort is normal.     Breath sounds: Normal breath sounds.  Abdominal:     General: Bowel sounds are normal.     Palpations: Abdomen is soft.  Musculoskeletal:     Cervical back: Normal range of motion and neck supple.  Skin:    General: Skin is warm and dry.     Capillary Refill: Capillary refill takes less than 2 seconds.  Neurological:     Mental Status: He is alert and oriented to person, place, and time.  Psychiatric:        Behavior: Behavior normal.      Musculoskeletal Exam: C-spine, thoracic spine, lumbar spine have good range of motion.  Shoulder joints have good range of motion with some discomfort bilaterally, left greater than right.  Elbow joints, wrist joints, MCPs, PIPs, DIPs have good range of motion with no synovitis.  Complete fist formation bilaterally.  PIP and DIP thickening consistent with osteoarthritis of both hands.  Hip joints have good range of motion with no groin pain.  Knee joints have good range of motion with no warmth or effusion.  Ankle joints have good range of motion with no tenderness or joint swelling.  No tenderness or synovitis of MTP joints.   CDAI Exam: CDAI Score: -- Patient Global: 4 mm; Provider Global: 4 mm Swollen: --; Tender: -- Joint Exam 03/06/2023   No joint exam has been documented for this visit   There is currently no information documented on the homunculus. Go to the Rheumatology  activity and complete the homunculus joint exam.  Investigation: No additional findings.  Imaging: No results found.  Recent Labs: Lab Results  Component Value Date   WBC 6.8 10/04/2022   HGB 15.7 10/04/2022   PLT 239 10/04/2022   NA 139 10/04/2022   K 4.7 10/04/2022   CL 103 10/04/2022   CO2 24 10/04/2022   GLUCOSE 110 (H) 10/04/2022   BUN 21 10/04/2022   CREATININE 1.09 10/04/2022   BILITOT 0.5 10/04/2022   ALKPHOS 58 08/27/2020   AST 28 10/04/2022   ALT 55 (H) 10/04/2022   PROT 7.5 10/04/2022   ALBUMIN 4.8 08/27/2020  CALCIUM 9.7 10/04/2022   GFRAA 108 12/22/2020   QFTBGOLDPLUS NEGATIVE 04/03/2022    Speciality Comments: No specialty comments available.  Procedures:  No procedures performed Allergies: Patient has no known allergies.   Assessment / Plan:     Visit Diagnoses: Rheumatoid arthritis with rheumatoid factor of multiple sites without organ or systems involvement (HCC) - RF+, CCP+: He has no joint tenderness or synovitis on examination today.  He has not had any signs or symptoms of a rheumatoid arthritis flare.  He has clinically been doing well on Enbrel 50 mg subcutaneous injections once weekly.  He is tolerating Enbrel without any side effects or injection site reactions.  He has not had any recent or recurrent infections.  He remains active with work and has occasional discomfort and stiffness in the evenings after work.  He has not noticed any inflammation.  He will remain on Enbrel as monotherapy.  He was advised to notify us if he develops signs or symptoms of a flare.  Discussed the importance of remaining active. He will follow-up in the office in 5 months or sooner if needed.  High risk medication use - Enbrel 50 mg sq injections once weekly. D/c MTX due to elevated LFTs.  CBC and CMP updated on 10/01/22. Orders for CBC and CMP released today. Her next lab work will be due in August and every 3 months  TB gold negative on 04/03/22.  Order for TB gold  released today  Recent or recurrent infections.  Discussed the importance of holding enbrel if he develops signs or symptoms of an infection and to resume once the infection has completely cleared. Discussed the importance of yearly skin examinations while on Enbrel due to the increased risk for nonmelanoma skin cancers. - Plan: COMPLETE METABOLIC PANEL WITH GFR, CBC with Differential/Platelet, QuantiFERON-TB Gold Plus  Screening for tuberculosis - Order for TB gold released today. Plan: QuantiFERON-TB Gold Plus  Elevated LFTs -ALT was 55 on 10/04/2022.  AST was within normal limits.  CMP will be updated today.  Plan: COMPLETE METABOLIC PANEL WITH GFR  Primary osteoarthritis of both hands: PIP and DIP thickening consistent with osteoarthritis of both hands.  No tenderness or inflammation noted.  Complete fist formation bilaterally.  Discussed the importance of joint protection and muscle strengthening.  Chronic pain of both knees: Good range of motion of both knee joints on examination today.  No warmth or effusion noted.  He has noticed some improvement in the discomfort and stiffness in his knee joints.  He plans on continuing to work on weight loss.  Discussed the importance of lower extremity muscle strengthening.  Elevated blood pressure reading: Blood pressure was 137/87 today in the office.  His blood pressure was rechecked prior to leaving.  BMI 37.0-37.9, adult   Orders: Orders Placed This Encounter  Procedures   COMPLETE METABOLIC PANEL WITH GFR   CBC with Differential/Platelet   QuantiFERON-TB Gold Plus   No orders of the defined types were placed in this encounter.    Follow-Up Instructions: Return in about 5 months (around 08/06/2023) for Rheumatoid arthritis, Osteoarthritis.   Gearldine Bienenstock, PA-C  Note - This record has been created using Dragon software.  Chart creation errors have been sought, but may not always  have been located. Such creation errors do not reflect  on  the standard of medical care.

## 2023-03-06 ENCOUNTER — Encounter: Payer: Self-pay | Admitting: Physician Assistant

## 2023-03-06 ENCOUNTER — Ambulatory Visit: Payer: Self-pay | Attending: Physician Assistant | Admitting: Physician Assistant

## 2023-03-06 VITALS — BP 137/87 | HR 65 | Resp 17 | Ht 68.0 in | Wt 245.8 lb

## 2023-03-06 DIAGNOSIS — M19042 Primary osteoarthritis, left hand: Secondary | ICD-10-CM

## 2023-03-06 DIAGNOSIS — Z6837 Body mass index (BMI) 37.0-37.9, adult: Secondary | ICD-10-CM

## 2023-03-06 DIAGNOSIS — A6002 Herpesviral infection of other male genital organs: Secondary | ICD-10-CM

## 2023-03-06 DIAGNOSIS — G8929 Other chronic pain: Secondary | ICD-10-CM

## 2023-03-06 DIAGNOSIS — Z111 Encounter for screening for respiratory tuberculosis: Secondary | ICD-10-CM

## 2023-03-06 DIAGNOSIS — M25562 Pain in left knee: Secondary | ICD-10-CM

## 2023-03-06 DIAGNOSIS — R7989 Other specified abnormal findings of blood chemistry: Secondary | ICD-10-CM

## 2023-03-06 DIAGNOSIS — Z789 Other specified health status: Secondary | ICD-10-CM

## 2023-03-06 DIAGNOSIS — M0579 Rheumatoid arthritis with rheumatoid factor of multiple sites without organ or systems involvement: Secondary | ICD-10-CM

## 2023-03-06 DIAGNOSIS — M19041 Primary osteoarthritis, right hand: Secondary | ICD-10-CM

## 2023-03-06 DIAGNOSIS — M25561 Pain in right knee: Secondary | ICD-10-CM

## 2023-03-06 DIAGNOSIS — R030 Elevated blood-pressure reading, without diagnosis of hypertension: Secondary | ICD-10-CM

## 2023-03-06 DIAGNOSIS — Z79899 Other long term (current) drug therapy: Secondary | ICD-10-CM

## 2023-03-06 LAB — CBC WITH DIFFERENTIAL/PLATELET
Basophils Absolute: 50 cells/uL (ref 0–200)
HCT: 47.4 % (ref 38.5–50.0)
Monocytes Relative: 7.5 %
Neutrophils Relative %: 49 %

## 2023-03-06 NOTE — Patient Instructions (Signed)
Standing Labs We placed an order today for your standing lab work.   Please have your standing labs drawn in August and every 3 months   Please have your labs drawn 2 weeks prior to your appointment so that the provider can discuss your lab results at your appointment, if possible.  Please note that you may see your imaging and lab results in MyChart before we have reviewed them. We will contact you once all results are reviewed. Please allow our office up to 72 hours to thoroughly review all of the results before contacting the office for clarification of your results.  WALK-IN LAB HOURS  Monday through Thursday from 8:00 am -12:30 pm and 1:00 pm-5:00 pm and Friday from 8:00 am-12:00 pm.  Patients with office visits requiring labs will be seen before walk-in labs.  You may encounter longer than normal wait times. Please allow additional time. Wait times may be shorter on  Monday and Thursday afternoons.  We do not book appointments for walk-in labs. We appreciate your patience and understanding with our staff.   Labs are drawn by [facility]. Please bring your co-pay at the time of your lab draw.  You may receive a bill from [facility] for your lab work.  Please note if you are on Hydroxychloroquine and and an order has been placed for a Hydroxychloroquine level,  you will need to have it drawn 4 hours or more after your last dose.  If you wish to have your labs drawn at another location, please call the office 24 hours in advance so we can fax the orders.  The office is located at [address], [address], [place], [place] [address]   If you have any questions regarding directions or hours of operation,  please call [phone].   As a reminder, please drink plenty of water prior to coming for your lab work. Thanks!  

## 2023-03-06 NOTE — Progress Notes (Signed)
CBC WNL

## 2023-03-07 LAB — CBC WITH DIFFERENTIAL/PLATELET
Eosinophils Relative: 6.8 %
Neutro Abs: 2744 cells/uL (ref 1500–7800)

## 2023-03-07 LAB — COMPLETE METABOLIC PANEL WITH GFR
AST: 18 U/L (ref 10–35)
BUN: 19 mg/dL (ref 7–25)
Creat: 1.03 mg/dL (ref 0.70–1.30)

## 2023-03-07 NOTE — Progress Notes (Signed)
Glucose is 119. ALT remains elevated but stable. Avoid tylenol and alcohol use. Rest of CMP WNL

## 2023-03-08 LAB — COMPLETE METABOLIC PANEL WITH GFR
AG Ratio: 1.8 (calc) (ref 1.0–2.5)
ALT: 57 U/L — ABNORMAL HIGH (ref 9–46)
Albumin: 4.5 g/dL (ref 3.6–5.1)
Alkaline phosphatase (APISO): 52 U/L (ref 35–144)
CO2: 26 mmol/L (ref 20–32)
Calcium: 9.3 mg/dL (ref 8.6–10.3)
Chloride: 105 mmol/L (ref 98–110)
Globulin: 2.5 g/dL (calc) (ref 1.9–3.7)
Glucose, Bld: 119 mg/dL — ABNORMAL HIGH (ref 65–99)
Potassium: 4.1 mmol/L (ref 3.5–5.3)
Sodium: 140 mmol/L (ref 135–146)
Total Bilirubin: 0.4 mg/dL (ref 0.2–1.2)
Total Protein: 7 g/dL (ref 6.1–8.1)
eGFR: 87 mL/min/{1.73_m2} (ref >=60)

## 2023-03-08 LAB — CBC WITH DIFFERENTIAL/PLATELET
Absolute Monocytes: 420 cells/uL (ref 200–950)
Basophils Relative: 0.9 %
Eosinophils Absolute: 381 cells/uL (ref 15–500)
Hemoglobin: 15.7 g/dL (ref 13.2–17.1)
Lymphs Abs: 2005 cells/uL (ref 850–3900)
MCH: 28.6 pg (ref 27.0–33.0)
MCHC: 33.1 g/dL (ref 32.0–36.0)
MCV: 86.5 fL (ref 80.0–100.0)
MPV: 11.4 fL (ref 7.5–12.5)
Platelets: 205 10*3/uL (ref 140–400)
RBC: 5.48 10*6/uL (ref 4.20–5.80)
RDW: 12.6 % (ref 11.0–15.0)
Total Lymphocyte: 35.8 %
WBC: 5.6 10*3/uL (ref 3.8–10.8)

## 2023-03-08 LAB — QUANTIFERON-TB GOLD PLUS
Mitogen-NIL: 8.29 IU/mL
NIL: 0.05 IU/mL
QuantiFERON-TB Gold Plus: NEGATIVE
TB1-NIL: <0 IU/mL
TB2-NIL: 0 IU/mL

## 2023-03-09 NOTE — Progress Notes (Signed)
TB gold negative

## 2023-06-25 ENCOUNTER — Telehealth: Payer: Self-pay | Admitting: Pharmacist

## 2023-06-25 NOTE — Telephone Encounter (Signed)
Patient dropped off Enbrel PAP renewal application. He remains uninsured. Provider form completed. Rx submitted for one month only as patient is due for labs  Submitted Patient Assistance RENEWAL Application to [employer] for ENBREL along with provider portion, patient portion, PA, medication list, insurance card copy. Will update patient when we receive a response.  Phone #: [phone] Fax #: [phone]   Chesley Mires, PharmD, MPH, BCPS, CPP Clinical Pharmacist (Rheumatology and Pulmonology)

## 2023-07-03 NOTE — Telephone Encounter (Signed)
Patient called the office back. Advised the patient Per rep patient's mother signed on the POA/guardian line of the paperwork. They are unable to process the PAP until they receive documentation of the mother having POA or the patient submits the application with his own signature. There is no documentation of POA on his file from what could be found.   Patient states his mother is not his POA. Patient inquired if he will be sent a new application to fill out. Patient inquires what he needs to do. Please advise.

## 2023-07-03 NOTE — Telephone Encounter (Signed)
Per rep patient's mother signed on the POA/guardian line of the paperwork. They are unable to process the PAP until they receive documentation of the mother having POA or the patient submits the application with his own signature. There is no documentation of POA on his file from what I could find. Unable to contact patient to discuss. LVM.   Sofie Rower, PharmD, [employer] PGY1

## 2023-07-04 NOTE — Telephone Encounter (Signed)
Will resubmit PAP with mother's name removed.  Chesley Mires, PharmD, MPH, BCPS, CPP Clinical Pharmacist (Rheumatology and Pulmonology)

## 2023-07-05 NOTE — Telephone Encounter (Signed)
Re-submitted Patient Assistance RENEWAL Application to [employer] for ENBREL along with provider portion, patient portion, medication list. Will update patient when we receive a response.   Phone #: [phone] Fax #: [phone]

## 2023-07-06 NOTE — Telephone Encounter (Signed)
Received fax from [employer] stating they require proof of gross household income for anyone in household. Left detailed VM for pt  John Blackwell, PharmD, MPH, BCPS, CPP Clinical Pharmacist (Rheumatology and Pulmonology)

## 2023-07-10 NOTE — Telephone Encounter (Signed)
Contacted patient to check the status of proof of gross household income submission. He has not submitted this to [employer]. He is finding it difficult because he has not yet submitted his taxes for 2023 and does not have pay stubs to provide as proof of income. He will try to file his taxes tomorrow and contact [employer] when he receives his return.   Sofie Rower, PharmD [employer] PGY1

## 2023-07-23 ENCOUNTER — Other Ambulatory Visit: Payer: Self-pay | Admitting: *Deleted

## 2023-07-23 ENCOUNTER — Telehealth: Payer: Self-pay | Admitting: *Deleted

## 2023-07-23 DIAGNOSIS — Z79899 Other long term (current) drug therapy: Secondary | ICD-10-CM

## 2023-07-23 NOTE — Telephone Encounter (Signed)
Patient contacted the office stating he is working on his Doctor, general practice for [employer]. Patient requested a sample of his medication. Patient advised he will need to update labs for the sample. Patient came by office for labs and provided Enbrel Mini sample.  Medication Samples have been provided to the patient.  Drug name: Enbrel Mini   Strength: 50 mg       Qty:1    LOT: [id number]   Exp.Date: 06/29/2025   Dosing instructions: Inject 50 mg into skin once weekly.

## 2023-07-24 LAB — COMPLETE METABOLIC PANEL WITH GFR
AG Ratio: 1.5 (calc) (ref 1.0–2.5)
ALT: 48 U/L — ABNORMAL HIGH (ref 9–46)
AST: 19 U/L (ref 10–35)
Albumin: 4.5 g/dL (ref 3.6–5.1)
Alkaline phosphatase (APISO): 68 U/L (ref 35–144)
BUN: 15 mg/dL (ref 7–25)
CO2: 26 mmol/L (ref 20–32)
Calcium: 9.5 mg/dL (ref 8.6–10.3)
Chloride: 104 mmol/L (ref 98–110)
Creat: 1.02 mg/dL (ref 0.70–1.30)
Globulin: 3 g/dL (calc) (ref 1.9–3.7)
Glucose, Bld: 138 mg/dL — ABNORMAL HIGH (ref 65–99)
Potassium: 4.5 mmol/L (ref 3.5–5.3)
Sodium: 139 mmol/L (ref 135–146)
Total Bilirubin: 0.4 mg/dL (ref 0.2–1.2)
Total Protein: 7.5 g/dL (ref 6.1–8.1)
eGFR: 88 mL/min/{1.73_m2} (ref >=60)

## 2023-07-24 LAB — CBC WITH DIFFERENTIAL/PLATELET
Absolute Monocytes: 466 cells/uL (ref 200–950)
Basophils Absolute: 47 cells/uL (ref 0–200)
Basophils Relative: 0.6 %
Eosinophils Absolute: 253 cells/uL (ref 15–500)
Eosinophils Relative: 3.2 %
HCT: 47.7 % (ref 38.5–50.0)
Hemoglobin: 15.3 g/dL (ref 13.2–17.1)
Lymphs Abs: 1880 cells/uL (ref 850–3900)
MCH: 28.4 pg (ref 27.0–33.0)
MCHC: 32.1 g/dL (ref 32.0–36.0)
MCV: 88.7 fL (ref 80.0–100.0)
MPV: 10.7 fL (ref 7.5–12.5)
Monocytes Relative: 5.9 %
Neutro Abs: 5254 cells/uL (ref 1500–7800)
Neutrophils Relative %: 66.5 %
Platelets: 264 10*3/uL (ref 140–400)
RBC: 5.38 10*6/uL (ref 4.20–5.80)
RDW: 12.3 % (ref 11.0–15.0)
Total Lymphocyte: 23.8 %
WBC: 7.9 10*3/uL (ref 3.8–10.8)

## 2023-07-24 NOTE — Progress Notes (Signed)
CBC is normal.  Glucose is elevated, probably not a fasting sample.  Liver functions remain elevated.  Please forward results to his PCP.

## 2023-08-13 ENCOUNTER — Other Ambulatory Visit: Payer: Self-pay | Admitting: *Deleted

## 2023-08-13 MED ORDER — ENBREL MINI 50 MG/ML ~~LOC~~ SOCT: 50.0000 mg | SUBCUTANEOUS | 0 refills | Status: DC

## 2023-08-13 NOTE — Telephone Encounter (Signed)
Last Fill: 10/09/2022  Labs: 07/23/2023 CBC is normal.  Glucose is elevated, probably not a fasting sample.  Liver functions remain elevated.  Please forward results to his PCP   TB Gold: 03/06/2023   TB gold negative   Next Visit: 08/30/2023   Last Visit: 03/06/2023  DX: Rheumatoid arthritis with rheumatoid factor of multiple sites without organ or systems involvement   Current Dose per office note 03/06/2023: Enbrel 50 mg sq injections once weekly.   Okay to refill Enbrel?

## 2023-08-13 NOTE — Telephone Encounter (Signed)
Received a fax from  [employer] regarding an approval for ENBREL patient assistance from 08/10/2023 to 08/08/2024. Approval letter sent to scan center.  Phone #: [phone] Fax #: [phone]  Left VM with patient  Chesley Mires, PharmD, MPH, BCPS, CPP Clinical Pharmacist (Rheumatology and Pulmonology)

## 2023-08-16 NOTE — Progress Notes (Deleted)
Office Visit Note  Patient: John Blackwell             Date of Birth: 10-19-1971           MRN: [id number]             PCP: Patient, No Pcp Per Referring: No ref. provider found Visit Date: 08/30/2023 Occupation: @GUAROCC @  Subjective:  No chief complaint on file.   History of Present Illness: John Blackwell is a 52 y.o. male ***     Activities of Daily Living:  Patient reports morning stiffness for *** {minute/hour:19697}.   Patient {ACTIONS;DENIES/REPORTS:21021675::"Denies"} nocturnal pain.  Difficulty dressing/grooming: {ACTIONS;DENIES/REPORTS:21021675::"Denies"} Difficulty climbing stairs: {ACTIONS;DENIES/REPORTS:21021675::"Denies"} Difficulty getting out of chair: {ACTIONS;DENIES/REPORTS:21021675::"Denies"} Difficulty using hands for taps, buttons, cutlery, and/or writing: {ACTIONS;DENIES/REPORTS:21021675::"Denies"}  No Rheumatology ROS completed.   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 Climbs Ladders.   Alcohol: 3-5 Beers each week day.                Friday Nights:  12 pack                Saturday: 12 pack                Sundays: None   Says that in past drank more. And also drank liquor in past but rarely drinks any liquor now.      Never smoked.    Single.    Has Girlfriend.   Has lost 25 lbs in past year secondary to diet changes-stopped eating late night.(entered 07/2013)   Was lifting weights until past 2 months-stopped sec to pain.   Immunization History  Administered Date(s) Administered   Tdap 01/31/2016     Objective: Vital Signs: There were no vitals taken for this visit.   Physical Exam   Musculoskeletal Exam: ***  CDAI Exam: CDAI Score: -- Patient Global: --; Provider Global: -- Swollen: --; Tender: -- Joint Exam 08/30/2023   No joint exam has been documented for this visit   There is currently no information documented on the homunculus. Go to the Rheumatology activity and complete the homunculus joint exam.  Investigation: No additional findings.  Imaging: No results found.  Recent Labs: Lab Results  Component Value Date   WBC 7.9 07/23/2023   HGB 15.3 07/23/2023   PLT 264 07/23/2023   NA 139 07/23/2023   K 4.5 07/23/2023   CL 104 07/23/2023   CO2 26 07/23/2023   GLUCOSE 138 (H) 07/23/2023   BUN 15 07/23/2023  CREATININE 1.02 07/23/2023   BILITOT 0.4 07/23/2023   ALKPHOS 58 08/27/2020   AST 19 07/23/2023   ALT 48 (H) 07/23/2023   PROT 7.5 07/23/2023   ALBUMIN 4.8 08/27/2020   CALCIUM 9.5 07/23/2023   GFRAA 108 12/22/2020   QFTBGOLDPLUS NEGATIVE 03/06/2023    Speciality Comments: No specialty comments available.  Procedures:  No procedures performed Allergies: Patient has no known allergies.   Assessment / Plan:     Visit Diagnoses: No diagnosis found.  Orders: No orders of the defined types were placed in this encounter.  No orders of the defined types were placed in this encounter.   Face-to-face time spent with patient was *** minutes. Greater than 50% of time was spent in counseling  and coordination of care.  Follow-Up Instructions: No follow-ups on file.   Ellen Henri, CMA  Note - This record has been created using Animal nutritionist.  Chart creation errors have been sought, but may not always  have been located. Such creation errors do not reflect on  the standard of medical care.

## 2023-08-30 ENCOUNTER — Ambulatory Visit: Payer: Self-pay | Admitting: Rheumatology

## 2023-08-30 DIAGNOSIS — Z79899 Other long term (current) drug therapy: Secondary | ICD-10-CM

## 2023-08-30 DIAGNOSIS — R7989 Other specified abnormal findings of blood chemistry: Secondary | ICD-10-CM

## 2023-08-30 DIAGNOSIS — G8929 Other chronic pain: Secondary | ICD-10-CM

## 2023-08-30 DIAGNOSIS — M19042 Primary osteoarthritis, left hand: Secondary | ICD-10-CM

## 2023-08-30 DIAGNOSIS — M0579 Rheumatoid arthritis with rheumatoid factor of multiple sites without organ or systems involvement: Secondary | ICD-10-CM

## 2023-09-03 NOTE — Progress Notes (Unsigned)
Office Visit Note  Patient: John Blackwell             Date of Birth: 11-Nov-1970           MRN: [id number]             PCP: Patient, No Pcp Per Referring: No ref. provider found Visit Date: 09/05/2023 Occupation: @GUAROCC @  Subjective:  Pain both knees  History of Present Illness: John Blackwell is a 52 y.o. male with seropositive rheumatoid arthritis and osteoarthritis.  He states he ran out of Enbrel for about 3 weeks in September.  During that time he developed a flare.  During that flare he developed pain and discomfort in his right shoulder and some swelling in his left ankle joint.  He resumed Enbrel 2 weeks ago.  The joint symptoms have been gradually improving.  He continues to have some stiffness but no swelling now.  He complains of ongoing pain and discomfort in his knee joints.    Activities of Daily Living:  Patient reports morning stiffness for 10-15 minutes.   Patient Denies nocturnal pain.  Difficulty dressing/grooming: Denies Difficulty climbing stairs: Denies Difficulty getting out of chair: Denies Difficulty using hands for taps, buttons, cutlery, and/or writing: Denies  Review of Systems  Constitutional:  Positive for fatigue.  HENT:  Negative for mouth sores and mouth dryness.   Eyes:  Negative for dryness.  Respiratory:  Negative for shortness of breath.   Cardiovascular:  Negative for chest pain and palpitations.  Gastrointestinal:  Negative for blood in stool, constipation and diarrhea.  Endocrine: Negative for increased urination.  Genitourinary:  Negative for involuntary urination.  Musculoskeletal:  Positive for joint pain, joint pain, myalgias, morning stiffness, muscle tenderness and myalgias. Negative for gait problem, joint swelling and muscle weakness.  Skin:  Negative for color change, rash, hair loss and sensitivity to sunlight.  Allergic/Immunologic: Negative for susceptible to infections.  Neurological:  Negative for dizziness and  headaches.  Hematological:  Negative for swollen glands.  Psychiatric/Behavioral:  Negative for depressed mood and sleep disturbance. The patient is not nervous/anxious.     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 Climbs Ladders.   Alcohol: 3-5 Beers each week day.                Friday Nights: 12 pack  Saturday: 12 pack                Sundays: None   Says that in past drank more. And also drank liquor in past but rarely drinks any liquor now.      Never smoked.    Single.    Has Girlfriend.   Has lost 25 lbs in past year secondary to diet changes-stopped eating late night.(entered 07/2013)   Was lifting weights until past 2 months-stopped sec to pain.   Immunization History  Administered Date(s) Administered   Tdap 01/31/2016     Objective: Vital Signs: BP 129/88 (BP Location: Left Arm, Patient Position: Sitting, Cuff Size: Normal)   Pulse 80   Resp 16   Ht 5\' 8"  (1.727 m)   Wt 245 lb  3.2 oz (111.2 kg)   BMI 37.28 kg/m    Physical Exam Vitals and nursing note reviewed.  Constitutional:      Appearance: He is well-developed.  HENT:     Head: Normocephalic and atraumatic.  Eyes:     Conjunctiva/sclera: Conjunctivae normal.     Pupils: Pupils are equal, round, and reactive to light.  Cardiovascular:     Rate and Rhythm: Normal rate and regular rhythm.     Heart sounds: Normal heart sounds.  Pulmonary:     Effort: Pulmonary effort is normal.     Breath sounds: Normal breath sounds.  Abdominal:     General: Bowel sounds are normal.     Palpations: Abdomen is soft.  Musculoskeletal:     Cervical back: Normal range of motion and neck supple.  Skin:    General: Skin is warm and dry.     Capillary Refill: Capillary refill takes less than 2 seconds.  Neurological:     Mental Status: He is alert and oriented to person, place, and time.  Psychiatric:        Behavior: Behavior normal.      Musculoskeletal Exam: Cervical, thoracic and lumbar spine were in good range of motion without any discomfort.  Shoulder joints, elbow joints, wrist joints, MCPs PIPs and DIPs with good range of motion.  He had bilateral PIP and DIP thickening with no synovitis.  Hip joints in good range of motion.  He discomfort range of motion of bilateral knee joints without any warmth swelling or effusion.  There was no tenderness over ankles or MTPs.  CDAI Exam: CDAI Score: -- Patient Global: --; Provider Global: -- Swollen: --; Tender: -- Joint Exam 09/05/2023   No joint exam has been documented for this visit   There is currently no information documented on the homunculus. Go to the Rheumatology activity and complete the homunculus joint exam.  Investigation: No additional findings.  Imaging: XR KNEE 3 VIEW LEFT  Result Date: 09/05/2023 Moderate to severe medial compartment narrowing was noted.  Moderate patellofemoral narrowing was noted.  No chondrocalcinosis was noted.  Impression: These findings suggestive of moderate to severe osteoarthritis and moderate chondromalacia patella.  XR KNEE 3 VIEW RIGHT  Result Date: 09/05/2023 Moderate medial compartment narrowing was noted.  Moderate patellofemoral narrowing was noted.  No chondrocalcinosis was noted. Impression: These findings are suggestive of moderate osteoarthritis and moderate chondromalacia patella.   Recent Labs: Lab Results  Component Value Date   WBC 7.9 07/23/2023   HGB 15.3 07/23/2023   PLT 264 07/23/2023   NA 139 07/23/2023   K 4.5 07/23/2023   CL 104 07/23/2023   CO2 26 07/23/2023   GLUCOSE 138 (  H) 07/23/2023   BUN 15 07/23/2023   CREATININE 1.02 07/23/2023   BILITOT 0.4 07/23/2023   ALKPHOS 58 08/27/2020   AST 19 07/23/2023   ALT 48 (H) 07/23/2023   PROT 7.5 07/23/2023   ALBUMIN 4.8 08/27/2020   CALCIUM 9.5 07/23/2023   GFRAA 108 12/22/2020   QFTBGOLDPLUS NEGATIVE 03/06/2023    Speciality Comments: No specialty comments available.  Procedures:  No procedures performed Allergies: Patient has no known allergies.   Assessment / Plan:     Visit Diagnoses: Rheumatoid arthritis with rheumatoid factor of multiple sites without organ or systems involvement (HCC) - RF+, CCP+: Patient had no synovitis on the examination today.  He states he ran out of Enbrel due to insurance issues for about 3 weeks and had a flare with increased pain in his right shoulder and left ankle.  He states he had ankle swelling.  He has been taking Enbrel on a regular basis for the last 2 weeks and the symptoms resolved.  He continues to have pain and discomfort in the bilateral knee joints.  No synovitis was noted on the examination  High risk medication use - Enbrel 50 mg sq injections once weekly. D/c MTX due to elevated LFTs.  Labs obtained on July 23, 2023 CBC and CMP were normal.  ALT was elevated at 48.  TB Gold was negative on Mar 06, 2023.  Will check labs again in 3 months.  Information on  immunization was placed in the AVS.  Patient was advised to hold Enbrel if he develops an infection and then restart after the fracture resolves.  Elevated LFTs-liver function stays elevated and stable.  Primary osteoarthritis of both hands-he has been lateral PIP and DIP thickening.  He has been raking leaves and having more stiffness in his hands.  No synovitis was noted.  Chronic pain of both knees-he continues to have pain and discomfort in his bilateral knee joints.  No warmth swelling or effusion was noted.  X-rays of bilateral knee joints 3 views were obtained.  X-rays showed bilateral moderate chondromalacia patella.  Right knee joint showed moderate osteoarthritis and left knee joint moderate to severe osteoarthritis.  X-ray findings were discussed with the patient.  I offered cortisone injection which she declined.  Use of topical Voltaren gel, and knee brace was advised.  A handout on lower extremity muscle strength exercises were given.  He will also benefit from weight loss.  BMI 37.0-37.9, adult  Orders: Orders Placed This Encounter  Procedures   XR KNEE 3 VIEW RIGHT   XR KNEE 3 VIEW LEFT   No orders of the defined types were placed in this encounter.    Follow-Up Instructions: Return in about 5 months (around 02/03/2024) for Rheumatoid arthritis, Osteoarthritis.   Pollyann Savoy, MD  Note - This record has been created using Animal nutritionist.  Chart creation errors have been sought, but may not always  have been located. Such creation errors do not reflect on  the standard of medical care.

## 2023-09-05 ENCOUNTER — Ambulatory Visit: Payer: Self-pay

## 2023-09-05 ENCOUNTER — Ambulatory Visit (INDEPENDENT_AMBULATORY_CARE_PROVIDER_SITE_OTHER): Payer: Self-pay

## 2023-09-05 ENCOUNTER — Ambulatory Visit: Payer: Self-pay | Attending: Rheumatology | Admitting: Rheumatology

## 2023-09-05 ENCOUNTER — Encounter: Payer: Self-pay | Admitting: Rheumatology

## 2023-09-05 VITALS — BP 129/88 | HR 80 | Resp 16 | Ht 68.0 in | Wt 245.2 lb

## 2023-09-05 DIAGNOSIS — Z6837 Body mass index (BMI) 37.0-37.9, adult: Secondary | ICD-10-CM

## 2023-09-05 DIAGNOSIS — G8929 Other chronic pain: Secondary | ICD-10-CM

## 2023-09-05 DIAGNOSIS — R030 Elevated blood-pressure reading, without diagnosis of hypertension: Secondary | ICD-10-CM

## 2023-09-05 DIAGNOSIS — R7989 Other specified abnormal findings of blood chemistry: Secondary | ICD-10-CM

## 2023-09-05 DIAGNOSIS — M25561 Pain in right knee: Secondary | ICD-10-CM

## 2023-09-05 DIAGNOSIS — M19042 Primary osteoarthritis, left hand: Secondary | ICD-10-CM

## 2023-09-05 DIAGNOSIS — M0579 Rheumatoid arthritis with rheumatoid factor of multiple sites without organ or systems involvement: Secondary | ICD-10-CM

## 2023-09-05 DIAGNOSIS — M25562 Pain in left knee: Secondary | ICD-10-CM

## 2023-09-05 DIAGNOSIS — M19041 Primary osteoarthritis, right hand: Secondary | ICD-10-CM

## 2023-09-05 DIAGNOSIS — Z79899 Other long term (current) drug therapy: Secondary | ICD-10-CM | POA: Insufficient documentation

## 2023-09-05 NOTE — Patient Instructions (Addendum)
Standing Labs We placed an order today for your standing lab work.   Please have your standing labs drawn in December and every 3 months  Please have your labs drawn 2 weeks prior to your appointment so that the provider can discuss your lab results at your appointment, if possible.  Please note that you may see your imaging and lab results in MyChart before we have reviewed them. We will contact you once all results are reviewed. Please allow our office up to 72 hours to thoroughly review all of the results before contacting the office for clarification of your results.  WALK-IN LAB HOURS  Monday through Thursday from 8:00 am -12:30 pm and 1:00 pm-5:00 pm and Friday from 8:00 am-12:00 pm.  Patients with office visits requiring labs will be seen before walk-in labs.  You may encounter longer than normal wait times. Please allow additional time. Wait times may be shorter on  Monday and Thursday afternoons.  We do not book appointments for walk-in labs. We appreciate your patience and understanding with our staff.   Labs are drawn by [facility]. Please bring your co-pay at the time of your lab draw.  You may receive a bill from [facility] for your lab work.  Please note if you are on Hydroxychloroquine and and an order has been placed for a Hydroxychloroquine level,  you will need to have it drawn 4 hours or more after your last dose.  If you wish to have your labs drawn at another location, please call the office 24 hours in advance so we can fax the orders.  The office is located at [address], [address], [place], [place] [address]   If you have any questions regarding directions or hours of operation,  please call [phone].   As a reminder, please drink plenty of water prior to coming for your lab work. Thanks!   Vaccines You are taking a medication(s) that can suppress your immune system.  The following immunizations are recommended: Flu annually Covid-19  RSV Td/Tdap (tetanus,  diphtheria, pertussis) every 10 years Pneumonia (Prevnar 15 then Pneumovax 23 at least 1 year apart.  Alternatively, can take Prevnar 20 without needing additional dose) Shingrix: 2 doses from 4 weeks to 6 months apart  Please check with your PCP to make sure you are up to date.   temporal arteritisIf you have signs or symptoms of an infection or start antibiotics: First, call your PCP for workup of your infection. Hold your medication through the infection, until you complete your antibiotics, and until symptoms resolve if you take the following: Injectable medication (Actemra, Benlysta, Cimzia, Cosentyx, Enbrel, Humira, Kevzara, Orencia, Remicade, Simponi, Stelara, Taltz, Tremfya) Methotrexate Leflunomide (Arava) Mycophenolate (Cellcept) Harriette Ohara, Olumiant, or Rinvoq   Please get an annual skin examination to screen for skin cancer while you are on Enbrel.  Please use sunscreen and sun protection  Exercises for Chronic Knee Pain Chronic knee pain is pain that lasts longer than 3 months. For most people with chronic knee pain, exercise and weight loss is an important part of treatment. Your health care provider may want you to focus on: Making the muscles that support your knee stronger. This can take pressure off your knee and reduce pain. Preventing knee stiffness. How far you can move your knee, keeping it there or making it farther. Losing weight (if this applies) to take pressure off your knee, lower your risk for injury, and make it easier for you to exercise. Your provider will help  you make an exercise program that fits your needs and physical abilities. Below are simple, low-impact exercises you can do at home. Ask your provider or physical therapist how often you should do your exercise program and how many times to repeat each exercise. General safety tips  Get your provider's approval before doing any exercises. Start slowly and stop any time you feel pain. Do not exercise if  your knee pain is flaring up. Warm up first. Stretching a cold muscle can cause an injury. Do 5-10 minutes of easy movement or light stretching before beginning your exercises. Do 5-10 minutes of low-impact activity (like walking or cycling) before starting strengthening exercises. Contact your provider any time you have pain during or after exercising. Exercise can cause discomfort but should not be painful. It is normal to be a little stiff or sore after exercising. Stretching and range-of-motion exercises Front thigh stretch  Stand up straight and support your body by holding on to a chair or resting one hand on a wall. With your legs straight and close together, bend one knee to lift your heel up toward your butt. Using one hand for support, grab your ankle with your free hand. Pull your foot up closer toward your butt to feel the stretch in front of your thigh. Hold the stretch for 30 seconds. Repeat __________ times. Complete this exercise __________ times a day. Back thigh stretch  Sit on the floor with your back straight and your legs out straight in front of you. Place the palms of your hands on the floor and slide them toward your feet as you bend at the hip. Try to touch your nose to your knees and feel the stretch in the back of your thighs. Hold for 30 seconds. Repeat __________ times. Complete this exercise __________ times a day. Calf stretch  Stand facing a wall. Place the palms of your hands flat against the wall, arms extended, and lean slightly against the wall. Get into a lunge position with one leg bent at the knee and the other leg stretched out straight behind you. Keep both feet facing the wall and increase the bend in your knee while keeping the heel of the other leg flat on the ground. You should feel the stretch in your calf. Hold for 30 seconds. Repeat __________ times. Complete this exercise __________ times a day. Strengthening exercises Straight leg  lift  Lie on your back with one knee bent and the other leg out straight. Slowly lift the straight leg without bending the knee. Lift until your foot is about 12 inches (30 cm) off the floor. Hold for 3-5 seconds and slowly lower your leg. Repeat __________ times. Complete this exercise __________ times a day. Single leg dip  Stand between two chairs and put both hands on the backs of the chairs for support. Extend one leg out straight with your body weight resting on the heel of the standing leg. Slowly bend your standing knee to dip your body to the level that is comfortable for you. Hold for 3-5 seconds. Repeat __________ times. Complete this exercise __________ times a day. Hamstring curls  Stand straight, knees close together, facing the back of a chair. Hold on to the back of a chair with both hands. Keep one leg straight. Bend the other knee while bringing the heel up toward the butt until the knee is bent at a 90-degree angle (right angle). Hold for 3-5 seconds. Repeat __________ times. Complete this exercise __________ times a  day. Wall squat  Stand straight with your back, hips, and head against a wall. Step forward one foot at a time with your back still against the wall. Your feet should be 2 feet (61 cm) from the wall at shoulder width. Keeping your back, hips, and head against the wall, slide down the wall to as close to a sitting position as you can get. Hold for 5-10 seconds, then slowly slide back up. Repeat __________ times. Complete this exercise __________ times a day. Step-ups  Stand in front of a sturdy platform or stool that is about 6 inches (15 cm) high. Slowly step up with your left / right foot, keeping your knee in line with your hip and foot. Do not let your knee bend so far that you cannot see your toes. Hold on to a chair for balance, but do not use it for support. Slowly unlock your knee and lower yourself to the starting position. Repeat __________  times. Complete this exercise __________ times a day. Contact a health care provider if: Your exercises cause pain. Your pain is worse after you exercise. Your pain prevents you from doing your exercises. This information is not intended to replace advice given to you by your health care provider. Make sure you discuss any questions you have with your health care provider. Document Revised: 10/31/2022 Document Reviewed: 10/31/2022 Elsevier Patient Education  2024 [employer].

## 2024-01-21 NOTE — Progress Notes (Signed)
 Office Visit Note  Patient: John Blackwell             Date of Birth: Aug 25, 1971           MRN: [id number]             PCP: Patient, No Pcp Per Referring: No ref. provider found Visit Date: 02/04/2024 Occupation: @GUAROCC @  Subjective:  Medication monitoring   History of Present Illness: ELLA GUILLOTTE is a 53 y.o. male with history of seropositive rheumatoid arthritis and osteoarthritis.  Patient remains on Enbrel 50 mg sq injections once weekly.  He is tolerating Enbrel without any side effects or injection site reactions.  He has not had any recent gaps in therapy.  He denies any signs or symptoms of a rheumatoid arthritis flare.  Patient experiences discomfort and stiffness in the evenings after climbing ladders and working a full workday.  He denies any joint swelling or morning stiffness at this time.  He has not had any nocturnal pain.  Patient denies any new medical conditions.  He denies any recurrent infections.  Patient continues to find his symptoms to be stable on Enbrel as prescribed.  Activities of Daily Living:  Patient reports morning stiffness for 0 minute.   Patient Denies nocturnal pain.  Difficulty dressing/grooming: Denies Difficulty climbing stairs: Denies Difficulty getting out of chair: Denies Difficulty using hands for taps, buttons, cutlery, and/or writing: Denies  Review of Systems  Constitutional:  Positive for fatigue.  HENT:  Negative for mouth sores and mouth dryness.   Eyes:  Negative for dryness.  Respiratory:  Negative for shortness of breath.   Cardiovascular:  Negative for chest pain and palpitations.  Gastrointestinal:  Negative for blood in stool, constipation and diarrhea.  Endocrine: Negative for increased urination.  Genitourinary:  Negative for involuntary urination.  Musculoskeletal:  Positive for joint pain, joint pain, joint swelling, myalgias and myalgias. Negative for gait problem, muscle weakness, morning stiffness and  muscle tenderness.  Skin:  Negative for color change, rash, hair loss and sensitivity to sunlight.  Allergic/Immunologic: Negative for susceptible to infections.  Neurological:  Negative for dizziness and headaches.  Hematological:  Negative for swollen glands.  Psychiatric/Behavioral:  Negative for depressed mood and sleep disturbance. The patient is not nervous/anxious.     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 Climbs Ladders.   Alcohol: 3-5 Beers each week day.  Friday Nights: 12 pack                Saturday: 12 pack                Sundays: None   Says that in past drank more. And also drank liquor in past but rarely drinks any liquor now.      Never smoked.    Single.    Has Girlfriend.   Has lost 25 lbs in past year secondary to diet changes-stopped eating late night.(entered 07/2013)   Was lifting weights until past 2 months-stopped sec to pain.   Immunization History  Administered  Date(s) Administered   Tdap 01/31/2016     Objective: Vital Signs: BP 132/78 (BP Location: Left Arm, Patient Position: Sitting, Cuff Size: Large)   Pulse 70   Resp 14   Ht 5\' 8"  (1.727 m)   Wt 248 lb (112.5 kg)   BMI 37.71 kg/m    Physical Exam Vitals and nursing note reviewed.  Constitutional:      Appearance: He is well-developed.  HENT:     Head: Normocephalic and atraumatic.  Eyes:     Conjunctiva/sclera: Conjunctivae normal.     Pupils: Pupils are equal, round, and reactive to light.  Cardiovascular:     Rate and Rhythm: Normal rate and regular rhythm.     Heart sounds: Normal heart sounds.  Pulmonary:     Effort: Pulmonary effort is normal.     Breath sounds: Normal breath sounds.  Abdominal:     General: Bowel sounds are normal.     Palpations: Abdomen is soft.  Musculoskeletal:     Cervical back: Normal range of motion and neck supple.  Skin:    General: Skin is warm and dry.     Capillary Refill: Capillary refill takes less than 2 seconds.  Neurological:     Mental Status: He is alert and oriented to person, place, and time.  Psychiatric:        Behavior: Behavior normal.      Musculoskeletal Exam: C-spine, thoracic spine, lumbar spine have good range of motion.  Shoulder joints, elbow joints, wrist joints, MCPs, PIPs, DIPs have good range of motion with no synovitis.  PIP and DIP thickening consistent with osteoarthritis of both hands.  Hip joints have good range of motion with no groin pain.  Knee joints have good range of motion with no warmth or effusion.  Ankle joints have good range of motion with no tenderness or joint swelling.  CDAI Exam: CDAI Score: -- Patient Global: --; Provider Global: -- Swollen: --; Tender: -- Joint Exam 02/04/2024   No joint exam has been documented for this visit   There is currently no information documented on the homunculus. Go to the Rheumatology activity and complete the homunculus joint exam.  Investigation: No  additional findings.  Imaging: No results found.  Recent Labs: Lab Results  Component Value Date   WBC 7.9 07/23/2023   HGB 15.3 07/23/2023   PLT 264 07/23/2023   NA 139 07/23/2023   K 4.5 07/23/2023   CL 104 07/23/2023   CO2 26 07/23/2023   GLUCOSE 138 (H) 07/23/2023   BUN 15 07/23/2023   CREATININE 1.02 07/23/2023   BILITOT 0.4 07/23/2023   ALKPHOS 58 08/27/2020   AST 19 07/23/2023   ALT 48 (H) 07/23/2023   PROT 7.5 07/23/2023   ALBUMIN 4.8 08/27/2020   CALCIUM 9.5 07/23/2023   GFRAA 108 12/22/2020   QFTBGOLDPLUS NEGATIVE  03/06/2023    Speciality Comments: No specialty comments available.  Procedures:  No procedures performed Allergies: Patient has no known allergies.   Assessment / Plan:     Visit Diagnoses: Rheumatoid arthritis with rheumatoid factor of multiple sites without organ or systems involvement (HCC) - RF+, CCP+: He has no joint tenderness or synovitis on examination today.  He has not had any signs or symptoms of a rheumatoid arthritis flare.  He has clinically been doing well on Enbrel 50 mg subcutaneous injections once weekly.  He is tolerating Enbrel without any side effects or injection site reactions.  He has not had any recent gaps in therapy.  His symptoms remain stable on Enbrel as prescribed.  He has not been experiencing any morning stiffness or nocturnal pain.  He experiences some increased discomfort and stiffness in his joints particularly in the evenings after working a long day on ladders.  Patient plans on working on weight loss to help alleviate some of his knee joint pain.  On examination he has good range of motion of both knees with no warmth or effusion.  He will remain on Enbrel 50 mg subcutaneous injections once weekly.  A refill of Enbrel was sent to the pharmacy today.  He was advised to notify us if he develops signs or symptoms of a flare.  He will follow-up in the office in 5 months or sooner if needed. - Plan: Lipid panel  High risk  medication use - Enbrel 50 mg sq injections once weekly. D/c MTX due to elevated LFTs.  CBC and CMP updated on 07/23/23. Orders for CBC and CMP released today.  TB gold negative on 03/06/23. TB gold order released today.  No recent or recurrent infections. Discussed the importance of holding enbrel if he develops signs or symptoms of an infection and to resume once the infection has completely cleared.  - Plan: CBC with Differential/Platelet, Comprehensive metabolic panel with GFR, QuantiFERON-TB Gold Plus, CBC with Differential/Platelet, Comprehensive metabolic panel with GFR, Lipid panel  Lipid screening - Lipid panel updated today.  Plan: Lipid panel  Elevated LFTs -AST within normal limits and ALT was elevated at 48 (trending down) on 07/23/2023.  CMP updated today.  Plan: Comprehensive metabolic panel with GFR  Primary osteoarthritis of both hands: He has PIP and DIP thickening consistent with osteoarthritis of both hands.  No tenderness or inflammation noted.  He experiences some increased discomfort and stiffness in his hands in the evenings after working a full day.  Chronic pain of both knees: Patient plans on working on weight loss to alleviate some of the discomfort in his knee joints.  He has good range of motion of both knees on examination today.  No warmth or effusion noted.  Other medical conditions are listed as follows:    BMI 37.0-37.9, adult  Elevated blood pressure reading: BP 132/78 updated today.     Orders: Orders Placed This Encounter  Procedures   CBC with Differential/Platelet   Comprehensive metabolic panel with GFR   QuantiFERON-TB Gold Plus   CBC with Differential/Platelet   Comprehensive metabolic panel with GFR   Lipid panel   Meds ordered this encounter  Medications   etanercept (ENBREL MINI) 50 MG/ML injection    Sig: Inject 1 mL (50 mg total) into the skin once a week.    Dispense:  12 mL    Refill:  0    Prescription Type::   Renewal     Follow-Up Instructions: Return in about 5  months (around 07/06/2024) for Rheumatoid arthritis.   Gearldine Bienenstock, PA-C  Note - This record has been created using Dragon software.  Chart creation errors have been sought, but may not always  have been located. Such creation errors do not reflect on  the standard of medical care.

## 2024-02-04 ENCOUNTER — Ambulatory Visit: Payer: Self-pay | Attending: Physician Assistant | Admitting: Physician Assistant

## 2024-02-04 ENCOUNTER — Encounter: Payer: Self-pay | Admitting: Physician Assistant

## 2024-02-04 VITALS — BP 132/78 | HR 70 | Resp 14 | Ht 68.0 in | Wt 248.0 lb

## 2024-02-04 DIAGNOSIS — R7989 Other specified abnormal findings of blood chemistry: Secondary | ICD-10-CM

## 2024-02-04 DIAGNOSIS — M25562 Pain in left knee: Secondary | ICD-10-CM

## 2024-02-04 DIAGNOSIS — M25561 Pain in right knee: Secondary | ICD-10-CM

## 2024-02-04 DIAGNOSIS — Z6837 Body mass index (BMI) 37.0-37.9, adult: Secondary | ICD-10-CM

## 2024-02-04 DIAGNOSIS — M19041 Primary osteoarthritis, right hand: Secondary | ICD-10-CM

## 2024-02-04 DIAGNOSIS — M0579 Rheumatoid arthritis with rheumatoid factor of multiple sites without organ or systems involvement: Secondary | ICD-10-CM

## 2024-02-04 DIAGNOSIS — R030 Elevated blood-pressure reading, without diagnosis of hypertension: Secondary | ICD-10-CM

## 2024-02-04 DIAGNOSIS — G8929 Other chronic pain: Secondary | ICD-10-CM

## 2024-02-04 DIAGNOSIS — Z13220 Encounter for screening for lipoid disorders: Secondary | ICD-10-CM

## 2024-02-04 DIAGNOSIS — M19042 Primary osteoarthritis, left hand: Secondary | ICD-10-CM

## 2024-02-04 DIAGNOSIS — Z79899 Other long term (current) drug therapy: Secondary | ICD-10-CM

## 2024-02-04 MED ORDER — ENBREL MINI 50 MG/ML ~~LOC~~ SOCT: 50.0000 mg | SUBCUTANEOUS | 0 refills | Status: DC

## 2024-02-04 NOTE — Patient Instructions (Signed)
Standing Labs We placed an order today for your standing lab work.   Please have your standing labs drawn in July and every 3 months   Please have your labs drawn 2 weeks prior to your appointment so that the provider can discuss your lab results at your appointment, if possible.  Please note that you may see your imaging and lab results in MyChart before we have reviewed them. We will contact you once all results are reviewed. Please allow our office up to 72 hours to thoroughly review all of the results before contacting the office for clarification of your results.  WALK-IN LAB HOURS  Monday through Thursday from 8:00 am -12:30 pm and 1:00 pm-5:00 pm and Friday from 8:00 am-12:00 pm.  Patients with office visits requiring labs will be seen before walk-in labs.  You may encounter longer than normal wait times. Please allow additional time. Wait times may be shorter on  Monday and Thursday afternoons.  We do not book appointments for walk-in labs. We appreciate your patience and understanding with our staff.   Labs are drawn by [facility]. Please bring your co-pay at the time of your lab draw.  You may receive a bill from [facility] for your lab work.  Please note if you are on Hydroxychloroquine and and an order has been placed for a Hydroxychloroquine level,  you will need to have it drawn 4 hours or more after your last dose.  If you wish to have your labs drawn at another location, please call the office 24 hours in advance so we can fax the orders.  The office is located at [address], [address], [place],  [address]   If you have any questions regarding directions or hours of operation,  please call [phone].   As a reminder, please drink plenty of water prior to coming for your lab work. Thanks!  

## 2024-02-05 NOTE — Progress Notes (Signed)
 Glucose is 119.  AST WNL. ALT remains elevated-73. Rest of CMP WNL.   CBC WNL Total cholesterol is elevated-235. HDL low-32.  Triglycerides are significantly elevated-658. Please notify the patient and forward results to PCP. He may need to establish care with a lipid specialist.

## 2024-02-07 LAB — COMPREHENSIVE METABOLIC PANEL WITH GFR
AG Ratio: 1.8 (calc) (ref 1.0–2.5)
ALT: 73 U/L — ABNORMAL HIGH (ref 9–46)
AST: 30 U/L (ref 10–35)
Albumin: 4.7 g/dL (ref 3.6–5.1)
Alkaline phosphatase (APISO): 55 U/L (ref 35–144)
BUN: 13 mg/dL (ref 7–25)
CO2: 27 mmol/L (ref 20–32)
Calcium: 9.4 mg/dL (ref 8.6–10.3)
Chloride: 102 mmol/L (ref 98–110)
Creat: 0.98 mg/dL (ref 0.70–1.30)
Globulin: 2.6 g/dL (ref 1.9–3.7)
Glucose, Bld: 119 mg/dL — ABNORMAL HIGH (ref 65–99)
Potassium: 4.2 mmol/L (ref 3.5–5.3)
Sodium: 137 mmol/L (ref 135–146)
Total Bilirubin: 0.4 mg/dL (ref 0.2–1.2)
Total Protein: 7.3 g/dL (ref 6.1–8.1)
eGFR: 92 mL/min/{1.73_m2} (ref >=60)

## 2024-02-07 LAB — QUANTIFERON-TB GOLD PLUS
Mitogen-NIL: 6.67 [IU]/mL
NIL: 0.04 [IU]/mL
QuantiFERON-TB Gold Plus: NEGATIVE
TB1-NIL: 0.01 [IU]/mL
TB2-NIL: 0.02 [IU]/mL

## 2024-02-07 LAB — CBC WITH DIFFERENTIAL/PLATELET
Absolute Lymphocytes: 2074 {cells}/uL (ref 850–3900)
Absolute Monocytes: 421 {cells}/uL (ref 200–950)
Basophils Absolute: 59 {cells}/uL (ref 0–200)
Basophils Relative: 1.1 %
Eosinophils Absolute: 421 {cells}/uL (ref 15–500)
Eosinophils Relative: 7.8 %
HCT: 47.6 % (ref 38.5–50.0)
Hemoglobin: 15.6 g/dL (ref 13.2–17.1)
MCH: 28.7 pg (ref 27.0–33.0)
MCHC: 32.8 g/dL (ref 32.0–36.0)
MCV: 87.5 fL (ref 80.0–100.0)
MPV: 11.2 fL (ref 7.5–12.5)
Monocytes Relative: 7.8 %
Neutro Abs: 2425 {cells}/uL (ref 1500–7800)
Neutrophils Relative %: 44.9 %
Platelets: 208 10*3/uL (ref 140–400)
RBC: 5.44 10*6/uL (ref 4.20–5.80)
RDW: 12.8 % (ref 11.0–15.0)
Total Lymphocyte: 38.4 %
WBC: 5.4 10*3/uL (ref 3.8–10.8)

## 2024-02-07 LAB — LIPID PANEL
Cholesterol: 235 mg/dL — ABNORMAL HIGH (ref <200)
HDL: 32 mg/dL — ABNORMAL LOW (ref >=40)
Non-HDL Cholesterol (Calc): 203 mg/dL — ABNORMAL HIGH (ref <130)
Total CHOL/HDL Ratio: 7.3 (calc) — ABNORMAL HIGH (ref <5.0)
Triglycerides: 658 mg/dL — ABNORMAL HIGH (ref <150)

## 2024-02-07 NOTE — Progress Notes (Signed)
 TB gold negative

## 2024-04-08 ENCOUNTER — Other Ambulatory Visit: Payer: Self-pay | Admitting: Rheumatology

## 2024-04-08 MED ORDER — ENBREL MINI 50 MG/ML ~~LOC~~ SOCT: 50.0000 mg | SUBCUTANEOUS | 0 refills | Status: AC

## 2024-04-08 NOTE — Telephone Encounter (Signed)
 Patient contacted the office to request a medication refill.   1. Name of Medication: Enbrel   2. How are you currently taking this medication (dosage and times per day)? 1ml once a week   3. What pharmacy would you like for that to be sent to? [facility]

## 2024-04-08 NOTE — Telephone Encounter (Signed)
 Last Fill: 02/04/2024  Labs: 02/04/2024 Glucose is 119. AST WNL. ALT remains elevated-73. Rest of CMP WNL.   CBC WNL  TB Gold: 02/04/2024 Neg    Next Visit: 07/07/2024  Last Visit: 02/04/2024  UE:[id number] arthritis with rheumatoid factor of multiple sites without organ or systems involvement   Current Dose per office note 02/04/2024: Enbrel  50 mg sq injections once weekly.   Okay to refill Enbrel ?

## 2024-06-23 NOTE — Progress Notes (Deleted)
 Office Visit Note  Patient: John Blackwell             Date of Birth: [birth year]           MRN: [id number]             PCP: Patient, No Pcp Per Referring: No ref. provider found Visit Date: 07/07/2024 Occupation: @GUAROCC @  Subjective:    History of Present Illness: John Blackwell is a 53 y.o. male with history of seropositive rheumatoid arthritis. Patient remains on Enbrel  50 mg sq injections once weekly.   CBC and CMP updated on 02/04/24.  Orders for CBC and CMP released. TB gold negative 02/04/24 Lipid panel updated on 02/04/24 Discussed the importance of holding enbrel  if he develops signs or symptoms of an infection and to resume once the infection has completely resolved.    Activities of Daily Living:  Patient reports morning stiffness for *** {minute/hour:19697}.   Patient {ACTIONS;DENIES/REPORTS:21021675::Denies} nocturnal pain.  Difficulty dressing/grooming: {ACTIONS;DENIES/REPORTS:21021675::Denies} Difficulty climbing stairs: {ACTIONS;DENIES/REPORTS:21021675::Denies} Difficulty getting out of chair: {ACTIONS;DENIES/REPORTS:21021675::Denies} Difficulty using hands for taps, buttons, cutlery, and/or writing: {ACTIONS;DENIES/REPORTS:21021675::Denies}  No Rheumatology ROS completed.   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 Climbs Ladders.   Alcohol: 3-5 Beers each week day.                Friday Nights: 12 pack                Saturday: 12 pack                Sundays: None   Says that in past drank more. And also drank liquor in past but rarely drinks any liquor now.      Never smoked.    Single.    Has Girlfriend.   Has lost 25 lbs in past year secondary to diet changes-stopped eating late night.(entered 07/2013)   Was lifting weights until past 2 months-stopped sec to pain.   Immunization History  Administered Date(s) Administered   Tdap 01/31/2016     Objective: Vital Signs: There were no vitals taken for this visit.   Physical Exam Vitals and nursing note reviewed.  Constitutional:      Appearance: He is well-developed.  HENT:     Head: Normocephalic and atraumatic.  Eyes:     Conjunctiva/sclera: Conjunctivae normal.     Pupils: Pupils are equal, round, and reactive to light.  Cardiovascular:  Rate and Rhythm: Normal rate and regular rhythm.     Heart sounds: Normal heart sounds.  Pulmonary:     Effort: Pulmonary effort is normal.     Breath sounds: Normal breath sounds.  Abdominal:     General: Bowel sounds are normal.     Palpations: Abdomen is soft.  Musculoskeletal:     Cervical back: Normal range of motion and neck supple.  Skin:    General: Skin is warm and dry.     Capillary Refill: Capillary refill takes less than 2 seconds.  Neurological:     Mental Status: He is alert and oriented to person, place, and time.  Psychiatric:        Behavior: Behavior normal.      Musculoskeletal Exam: ***  CDAI Exam: CDAI Score:  -- Patient Global: --; Provider Global: -- Swollen: --; Tender: -- Joint Exam 07/07/2024   No joint exam has been documented for this visit   There is currently no information documented on the homunculus. Go to the Rheumatology activity and complete the homunculus joint exam.  Investigation: No additional findings.  Imaging: No results found.  Recent Labs: Lab Results  Component Value Date   WBC 5.4 02/04/2024   HGB 15.6 02/04/2024   PLT 208 02/04/2024   NA 137 02/04/2024   K 4.2 02/04/2024   CL 102 02/04/2024   CO2 27 02/04/2024   GLUCOSE 119 (H) 02/04/2024   BUN 13 02/04/2024   CREATININE 0.98 02/04/2024   BILITOT 0.4 02/04/2024   ALKPHOS 58 08/27/2020   AST 30 02/04/2024   ALT 73 (H) 02/04/2024   PROT 7.3 02/04/2024   ALBUMIN 4.8 08/27/2020   CALCIUM 9.4 02/04/2024   GFRAA 108 12/22/2020   QFTBGOLDPLUS NEGATIVE 02/04/2024    Speciality Comments: No specialty comments available.  Procedures:  No procedures performed Allergies: Patient has no known allergies.   Assessment / Plan:     Visit Diagnoses: Rheumatoid arthritis with rheumatoid factor of multiple sites without organ or systems involvement (HCC)  High risk medication use  Elevated LFTs  Primary osteoarthritis of both hands  Chronic pain of both knees  BMI 37.0-37.9, adult  Orders: No orders of the defined types were placed in this encounter.  No orders of the defined types were placed in this encounter.   Face-to-face time spent with patient was *** minutes. Greater than 50% of time was spent in counseling and coordination of care.  Follow-Up Instructions: No follow-ups on file.   Waddell CHRISTELLA Craze, PA-C  Note - This record has been created using Dragon software.  Chart creation errors have been sought, but may not always  have been located. Such creation errors do not reflect on  the standard of medical care.

## 2024-07-07 ENCOUNTER — Ambulatory Visit: Payer: Self-pay | Admitting: Physician Assistant

## 2024-07-07 DIAGNOSIS — M0579 Rheumatoid arthritis with rheumatoid factor of multiple sites without organ or systems involvement: Secondary | ICD-10-CM

## 2024-07-07 DIAGNOSIS — R7989 Other specified abnormal findings of blood chemistry: Secondary | ICD-10-CM

## 2024-07-07 DIAGNOSIS — Z6837 Body mass index (BMI) 37.0-37.9, adult: Secondary | ICD-10-CM

## 2024-07-07 DIAGNOSIS — G8929 Other chronic pain: Secondary | ICD-10-CM

## 2024-07-07 DIAGNOSIS — M19041 Primary osteoarthritis, right hand: Secondary | ICD-10-CM

## 2024-07-07 DIAGNOSIS — Z79899 Other long term (current) drug therapy: Secondary | ICD-10-CM

## 2024-09-17 ENCOUNTER — Telehealth: Payer: Self-pay | Admitting: Rheumatology

## 2024-09-17 NOTE — Telephone Encounter (Signed)
 Patient contacted the office to request a medication refill.   1. Name of Medication: Embrel  2. How are you currently taking this medication (dosage and times per day)? Once a week   3. What pharmacy would you like for that to be sent to? [facility]

## 2024-09-17 NOTE — Telephone Encounter (Signed)
 Patient last had labs April 2025. Attempted to contact the patient and left message to advise patient he is due for labs.

## 2024-10-01 NOTE — Progress Notes (Unsigned)
 Office Visit Note  Patient: John Blackwell             Date of Birth: [birth year]           MRN: [id number]             PCP: Patient, No Pcp Per Referring: No ref. provider found Visit Date: 10/15/2024 Occupation: Data Unavailable  Subjective:  Medication monitoring  History of Present Illness: John Blackwell is a 52 y.o. male with history of seropositive rheumatoid arthritis and osteoarthritis.  Patient remains on  Enbrel  50 mg sq injections once weekly.  He is tolerating Enbrel  without any side effects or injection site reactions.  He has not had any recent gaps in therapy.  Patient experiences discomfort in both knees and both ankles in the evening after working on ladders all day.  He has stiffness in both hands at times but denis any joint swelling.     Activities of Daily Living:  Patient reports morning stiffness for 30-45 minutes.   Patient  nocturnal pain.  Difficulty dressing/grooming: DeniesDenies Difficulty climbing stairs: Denies Difficulty getting out of chair: Denies Difficulty using hands for taps, buttons, cutlery, and/or writing: Denies  Review of Systems  Constitutional:  Positive for fatigue.  HENT:  Negative for mouth sores and mouth dryness.   Eyes:  Negative for dryness.  Respiratory:  Negative for shortness of breath.   Cardiovascular:  Negative for chest pain and palpitations.  Gastrointestinal:  Negative for blood in stool, constipation and diarrhea.  Endocrine: Negative for increased urination.  Genitourinary:  Negative for involuntary urination.  Musculoskeletal:  Positive for joint pain, joint pain, joint swelling, myalgias, morning stiffness and myalgias. Negative for gait problem, muscle weakness and muscle tenderness.  Skin:  Negative for color change, rash, hair loss and sensitivity to sunlight.  Allergic/Immunologic: Negative for susceptible to infections.  Neurological:  Negative for dizziness and headaches.  Hematological:  Negative  for swollen glands.  Psychiatric/Behavioral:  Negative for depressed mood and sleep disturbance. The patient is not nervous/anxious.     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    RA (rheumatoid arthritis) (HCC)     Family History  Problem Relation Age of Onset   Fibromyalgia Mother    Thyroid disease Brother    Healthy Daughter    Past Surgical History:  Procedure Laterality Date   LAPAROSCOPIC APPENDECTOMY N/A 04/11/2016   Procedure: APPENDECTOMY LAPAROSCOPIC;  Surgeon: Alm Angle, MD;  Location: [facility];  Service: General;  Laterality: N/A;   Social History   Tobacco Use   Smoking status: Never    Passive exposure: Current   Smokeless tobacco: Never  Vaping Use   Vaping status: Never Used  Substance Use Topics   Alcohol use: Yes    Comment: occ   Drug use: Never   Social History   Social History Narrative   Does Gutter Work. Climbs Ladders.  Alcohol: 3-5 Beers each week day.                Friday Nights: 12 pack                Saturday: 12 pack                Sundays: None   Says that in past drank more. And also drank liquor in past but rarely drinks any liquor now.      Never smoked.    Single.    Has Girlfriend.   Has lost 25 lbs in past year secondary to diet changes-stopped eating late night.(entered 07/2013)   Was lifting weights until past 2 months-stopped sec to pain.     Immunization History  Administered  Date(s) Administered   Tdap 01/31/2016     Objective: Vital Signs: BP (!) 145/94 (BP Location: Right Arm, Patient Position: Sitting, Cuff Size: Normal)   Pulse 67   Temp 98 F (36.7 C)   Resp 16   Ht 5' 8 (1.727 m)   Wt 247 lb 6.4 oz (112.2 kg)   BMI 37.62 kg/m    Physical Exam Vitals and nursing note reviewed.  Constitutional:      Appearance: He is well-developed.  HENT:     Head: Normocephalic and atraumatic.  Eyes:     Conjunctiva/sclera: Conjunctivae normal.     Pupils: Pupils are equal, round, and reactive to light.  Cardiovascular:     Rate and Rhythm: Normal rate and regular rhythm.     Heart sounds: Normal heart sounds.  Pulmonary:     Effort: Pulmonary effort is normal.     Breath sounds: Normal breath sounds.  Abdominal:     General: Bowel sounds are normal.     Palpations: Abdomen is soft.  Musculoskeletal:     Cervical back: Normal range of motion and neck supple.  Skin:    General: Skin is warm and dry.     Capillary Refill: Capillary refill takes less than 2 seconds.  Neurological:     Mental Status: He is alert and oriented to person, place, and time.  Psychiatric:        Behavior: Behavior normal.      Musculoskeletal Exam: C-spine, thoracic spine, lumbar spine have good range of motion.  No midline spinal tenderness.  No SI joint tenderness.  Shoulder joints, elbow joints, wrist joints, MCPs, PIPs, DIPs have good range of motion with no synovitis. PIP and DIP thickening consistent with osteoarthritis of both hands. Complete fist formation bilaterally.  Hip joints have good range of motion with no groin pain.  Knee joints have good range of motion no warmth or effusion.  Ankle joints have good range of motion no tenderness or joint swelling.    CDAI Exam: CDAI Score: -- Patient Global: --; Provider Global: -- Swollen: --; Tender: -- Joint Exam 10/15/2024   No joint exam has been documented for this visit   There is currently no information  documented on the homunculus. Go to the Rheumatology activity and complete the homunculus joint exam.  Investigation: No additional findings.  Imaging: No results found.  Recent Labs: Lab Results  Component Value Date   WBC 5.4 02/04/2024   HGB 15.6 02/04/2024   PLT 208 02/04/2024   NA 137 02/04/2024   K 4.2 02/04/2024   CL 102 02/04/2024   CO2 27 02/04/2024   GLUCOSE 119 (H) 02/04/2024   BUN 13 02/04/2024   CREATININE 0.98  02/04/2024   BILITOT 0.4 02/04/2024   ALKPHOS 58 08/27/2020   AST 30 02/04/2024   ALT 73 (H) 02/04/2024   PROT 7.3 02/04/2024   ALBUMIN 4.8 08/27/2020   CALCIUM 9.4 02/04/2024   GFRAA 108 12/22/2020   QFTBGOLDPLUS NEGATIVE 02/04/2024    Speciality Comments: No specialty comments available.  Procedures:  No procedures performed Allergies: Patient has no known allergies.   Assessment / Plan:     Visit Diagnoses: Rheumatoid arthritis with rheumatoid factor of multiple sites without organ or systems involvement (HCC) - RF+, CCP+: He has no synovitis on examination.  Patient continues to experience intermittent pain and stiffness affecting both hands, both knees, both ankle joints.  Overall his symptoms have been manageable and he has not been taking over-the-counter products for symptomatic relief.  He remains on Enbrel  50 mg sq injections once weekly.  He is tolerating Enbrel  without any side effects or injection site reactions. He has not noticed any breakthrough symptoms between injections unless he has a gap in therapy.   Plan to try reapply for Enbrel  patient assistance.  Plan to update CBC and CMP today.  He will notify us  if he develops any new or worsening symptoms. He will follow up in 5 months or sooner if needed.  High risk medication use - Enbrel  50 mg sq injections once weekly. D/c MTX due to elevated LFTs.  CBC and CMP updated on 02/04/24. Orders for CBC and CMP released today.  TB gold negative on 02/04/24 Lipid panel updated on  02/04/24. Discussed the importance of holding enbrel  if he develops signs or symptoms of an infection and to resume once the infection has completely cleared.  - Plan: CBC with Differential/Platelet, Comprehensive metabolic panel with GFR  Elevated LFTs -AST within normal limits.  ALT elevated-73 on 02/04/24.  He has been avoiding OTC products for pain relief.  CMP updated today.  plan: Comprehensive metabolic panel with GFR  Primary osteoarthritis of both hands: He has PIP and DIP thickening consistent with osteoarthritis of both hands.  No synovitis noted today.  Discussed the importance of joint protection and muscle strengthening.  Chronic pain of both knees: X-rays of both knees were obtained on 09/05/2023.  X-rays of the left knee were consistent with moderate to severe osteoarthritis and moderate chondromalacia patella.  X-rays of the right knee were consistent with moderate osteoarthritis and moderate chondromalacia patella.  He continues to experience intermittent pain and stiffness affecting both knees especially after standing on ladders for prolonged periods of time for work.  No warmth or effusion noted on examination today.  Orders: Orders Placed This Encounter  Procedures   CBC with Differential/Platelet   Comprehensive metabolic panel with GFR   No orders of the defined types were placed in this encounter.   Follow-Up Instructions: No follow-ups on file.   Waddell CHRISTELLA Craze, PA-C  Note - This record has been created using Dragon software.  Chart creation errors have been sought, but may not always  have been located. Such creation errors do not reflect on  the standard of medical care.

## 2024-10-15 ENCOUNTER — Encounter: Payer: Self-pay | Admitting: Physician Assistant

## 2024-10-15 ENCOUNTER — Ambulatory Visit: Payer: Self-pay | Attending: Physician Assistant | Admitting: Physician Assistant

## 2024-10-15 VITALS — BP 146/93 | HR 67 | Temp 98.0°F | Resp 16 | Ht 68.0 in | Wt 247.4 lb

## 2024-10-15 DIAGNOSIS — M25561 Pain in right knee: Secondary | ICD-10-CM

## 2024-10-15 DIAGNOSIS — Z79899 Other long term (current) drug therapy: Secondary | ICD-10-CM

## 2024-10-15 DIAGNOSIS — M25562 Pain in left knee: Secondary | ICD-10-CM

## 2024-10-15 DIAGNOSIS — G8929 Other chronic pain: Secondary | ICD-10-CM

## 2024-10-15 DIAGNOSIS — M0579 Rheumatoid arthritis with rheumatoid factor of multiple sites without organ or systems involvement: Secondary | ICD-10-CM

## 2024-10-15 DIAGNOSIS — M19041 Primary osteoarthritis, right hand: Secondary | ICD-10-CM

## 2024-10-15 DIAGNOSIS — M19042 Primary osteoarthritis, left hand: Secondary | ICD-10-CM

## 2024-10-15 DIAGNOSIS — R7989 Other specified abnormal findings of blood chemistry: Secondary | ICD-10-CM

## 2024-10-16 ENCOUNTER — Ambulatory Visit: Payer: Self-pay | Admitting: Physician Assistant

## 2024-10-16 LAB — COMPREHENSIVE METABOLIC PANEL WITH GFR
AG Ratio: 1.6 (calc) (ref 1.0–2.5)
ALT: 53 U/L — ABNORMAL HIGH (ref 9–46)
AST: 27 U/L (ref 10–35)
Albumin: 4.8 g/dL (ref 3.6–5.1)
Alkaline phosphatase (APISO): 55 U/L (ref 35–144)
BUN: 15 mg/dL (ref 7–25)
CO2: 27 mmol/L (ref 20–32)
Calcium: 9.8 mg/dL (ref 8.6–10.3)
Chloride: 102 mmol/L (ref 98–110)
Creat: 1.06 mg/dL (ref 0.70–1.30)
Globulin: 3 g/dL (ref 1.9–3.7)
Glucose, Bld: 84 mg/dL (ref 65–99)
Potassium: 4.4 mmol/L (ref 3.5–5.3)
Sodium: 138 mmol/L (ref 135–146)
Total Bilirubin: 0.5 mg/dL (ref 0.2–1.2)
Total Protein: 7.8 g/dL (ref 6.1–8.1)
eGFR: 83 mL/min/1.73m2 (ref >=60)

## 2024-10-16 LAB — CBC WITH DIFFERENTIAL/PLATELET
Absolute Lymphocytes: 2629 {cells}/uL (ref 850–3900)
Absolute Monocytes: 552 {cells}/uL (ref 200–950)
Basophils Absolute: 50 {cells}/uL (ref 0–200)
Basophils Relative: 0.8 %
Eosinophils Absolute: 360 {cells}/uL (ref 15–500)
Eosinophils Relative: 5.8 %
HCT: 48.8 % (ref 39.4–51.1)
Hemoglobin: 15.4 g/dL (ref 13.2–17.1)
MCH: 27.6 pg (ref 27.0–33.0)
MCHC: 31.6 g/dL (ref 31.6–35.4)
MCV: 87.5 fL (ref 81.4–101.7)
MPV: 10.6 fL (ref 7.5–12.5)
Monocytes Relative: 8.9 %
Neutro Abs: 2610 {cells}/uL (ref 1500–7800)
Neutrophils Relative %: 42.1 %
Platelets: 261 Thousand/uL (ref 140–400)
RBC: 5.58 Million/uL (ref 4.20–5.80)
RDW: 12.3 % (ref 11.0–15.0)
Total Lymphocyte: 42.4 %
WBC: 6.2 Thousand/uL (ref 3.8–10.8)

## 2024-10-16 NOTE — Progress Notes (Signed)
 CBC WNL ALT remains elevated but has improved. AST WNL.  Recommend continuing to avoid tylenol  and alcohol use.  Rest of CMP WNL

## 2024-11-11 ENCOUNTER — Telehealth: Payer: Self-pay

## 2024-11-11 NOTE — Telephone Encounter (Signed)
 Pt had signed paperwork during previous OV on 10/15/24, however at that time he was unsure of financial information and was going to reach back out to me to complete at a later time. Attempted to contact pt to discuss, left VoiceMail requesting a return call. Direct office number provided.

## 2024-11-27 NOTE — Telephone Encounter (Signed)
 Patient left a voicemail to follow up on enbrel . He said he had dropped his tax information off at our office and would like to know an update. Thanks!

## 2024-11-28 NOTE — Telephone Encounter (Signed)
 Submitted Patient Assistance Application to [employer] for ENBREL  along with patient portion, provider portion, and current medication list. Will update patient when we receive a response.  Phone #: [phone] Fax #: [phone]

## 2024-12-04 NOTE — Telephone Encounter (Signed)
 Patient called to speak with [facility] regarding his enbrel  application. Thanks!

## 2025-03-17 ENCOUNTER — Ambulatory Visit: Payer: Self-pay | Admitting: Physician Assistant
